# Patient Record
Sex: Male | Born: 1970 | Race: White | Hispanic: No | Marital: Married | Smoking: Never smoker
Health system: Southern US, Community
[De-identification: ages and names within clinical notes are randomized; demographics above are authoritative.]

## PROBLEM LIST (undated history)

## (undated) DIAGNOSIS — E7800 Pure hypercholesterolemia, unspecified: Secondary | ICD-10-CM

## (undated) DIAGNOSIS — B019 Varicella without complication: Secondary | ICD-10-CM

## (undated) DIAGNOSIS — Q892 Congenital malformations of other endocrine glands: Secondary | ICD-10-CM

## (undated) DIAGNOSIS — J309 Allergic rhinitis, unspecified: Secondary | ICD-10-CM

## (undated) DIAGNOSIS — T7840XA Allergy, unspecified, initial encounter: Secondary | ICD-10-CM

## (undated) HISTORY — DX: Congenital malformations of other endocrine glands: Q89.2

## (undated) HISTORY — DX: Varicella without complication: B01.9

## (undated) HISTORY — DX: Allergy, unspecified, initial encounter: T78.40XA

## (undated) HISTORY — PX: MYRINGOPLASTY: SUR873

## (undated) HISTORY — DX: Pure hypercholesterolemia, unspecified: E78.00

## (undated) HISTORY — PX: EYE SURGERY: SHX253

## (undated) HISTORY — DX: Allergic rhinitis, unspecified: J30.9

## (undated) HISTORY — PX: WISDOM TOOTH EXTRACTION: SHX21

---

## 1990-01-07 HISTORY — PX: INGUINAL HERNIA REPAIR: SUR1180

## 2003-01-08 HISTORY — PX: REFRACTIVE SURGERY: SHX103

## 2006-06-18 ENCOUNTER — Encounter: Admission: RE | Admit: 2006-06-18 | Discharge: 2006-06-18 | Payer: Self-pay | Admitting: *Deleted

## 2006-12-18 ENCOUNTER — Encounter: Admission: RE | Admit: 2006-12-18 | Discharge: 2006-12-18 | Payer: Self-pay | Admitting: *Deleted

## 2007-01-05 ENCOUNTER — Encounter: Admission: RE | Admit: 2007-01-05 | Discharge: 2007-01-05 | Payer: Self-pay | Admitting: Family Medicine

## 2009-09-19 ENCOUNTER — Encounter: Admission: RE | Admit: 2009-09-19 | Discharge: 2009-09-19 | Payer: Self-pay | Admitting: Internal Medicine

## 2010-01-28 ENCOUNTER — Encounter: Payer: Self-pay | Admitting: *Deleted

## 2010-12-12 IMAGING — US US SOFT TISSUE HEAD/NECK
1 series · 14 of 25 positions shown · non-contrast
Comparison: 01/05/2007 MR of the neck and 12/18/2006 ultrasound of
the thyroid.

CLINICAL DATA: Follow-up thyroglossal duct cyst.

THYROID ULTRASOUND
TECHNIQUE: Ultrasound examination of the thyroid gland and adjacent
soft tissues was performed.

[Series 1: us soft tissue head/neck · 0.07mm/px · 14 of 26 slices shown]
[im 1/26]
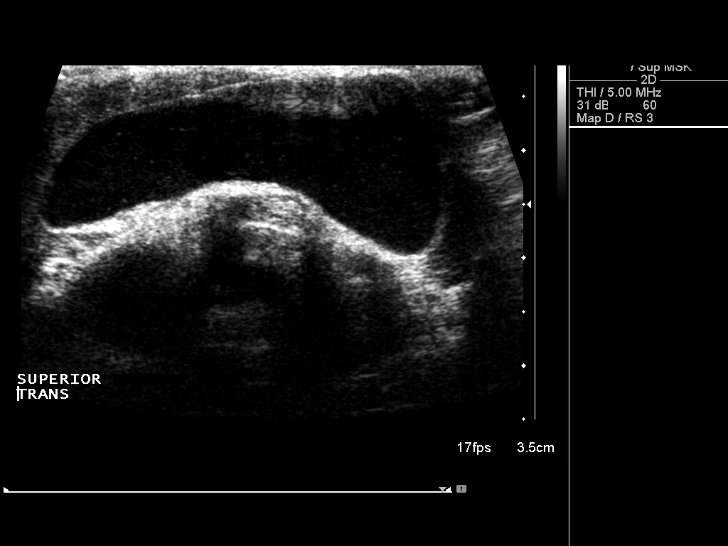
[im 3/26]
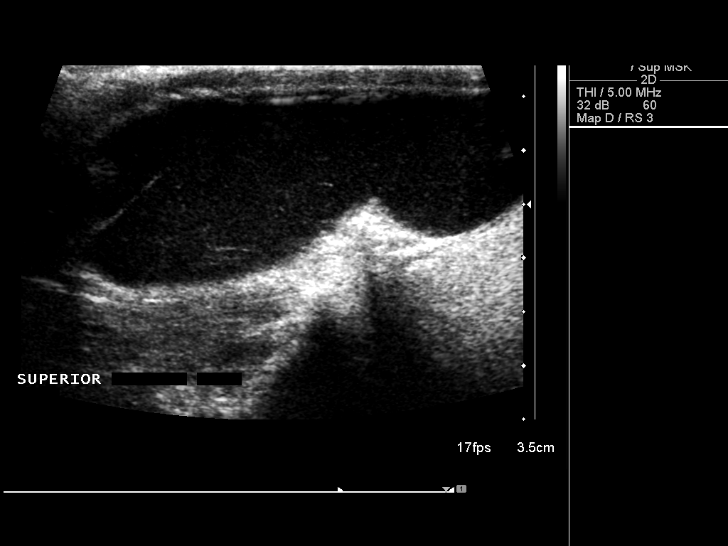
[im 5/26]
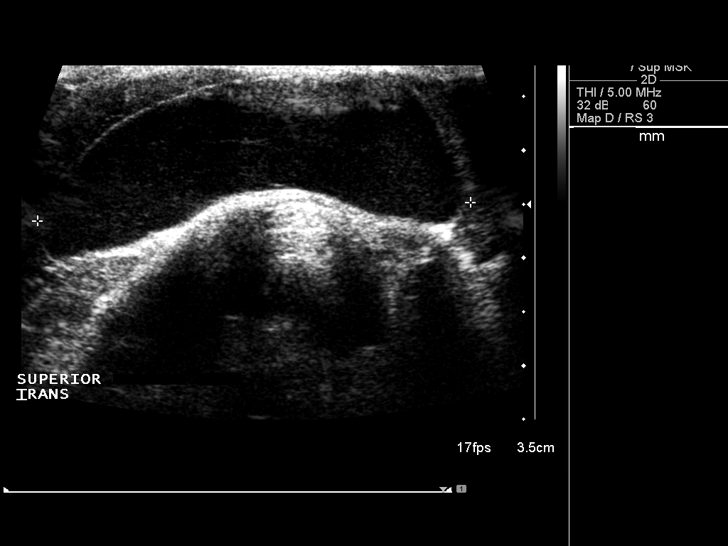
[im 7/26]
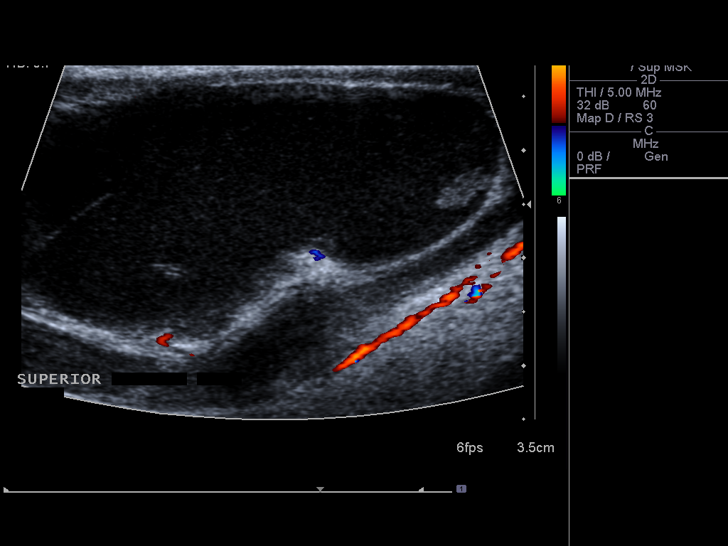
[im 9/26]
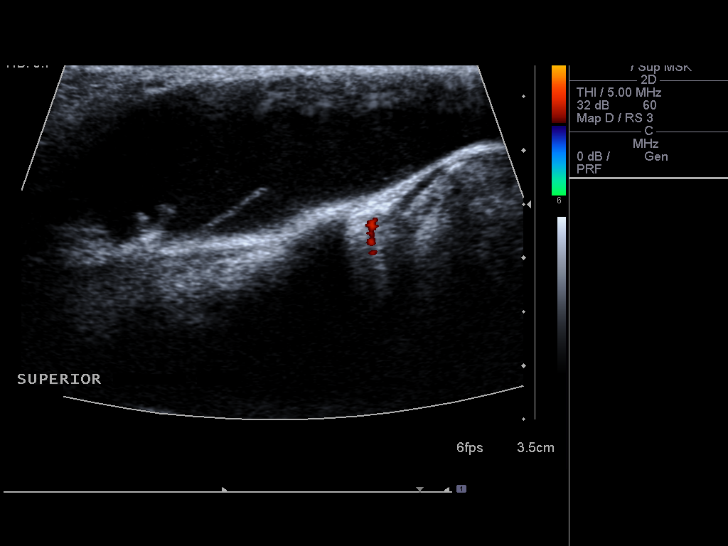
[im 10/26]
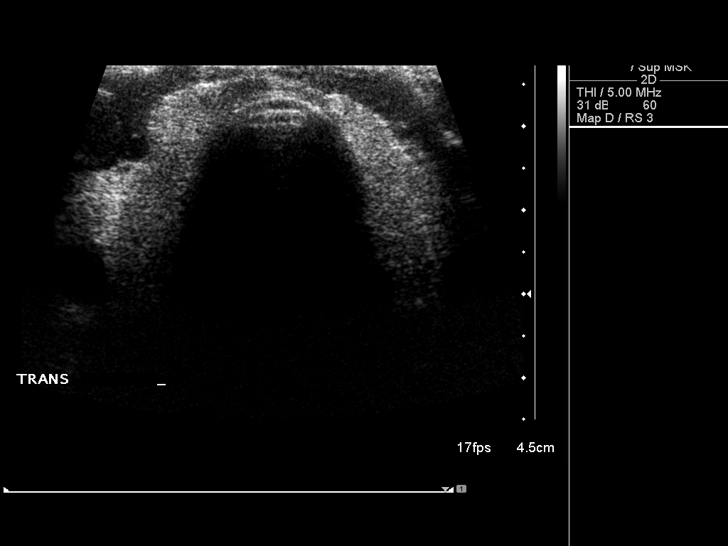
[im 12/26]
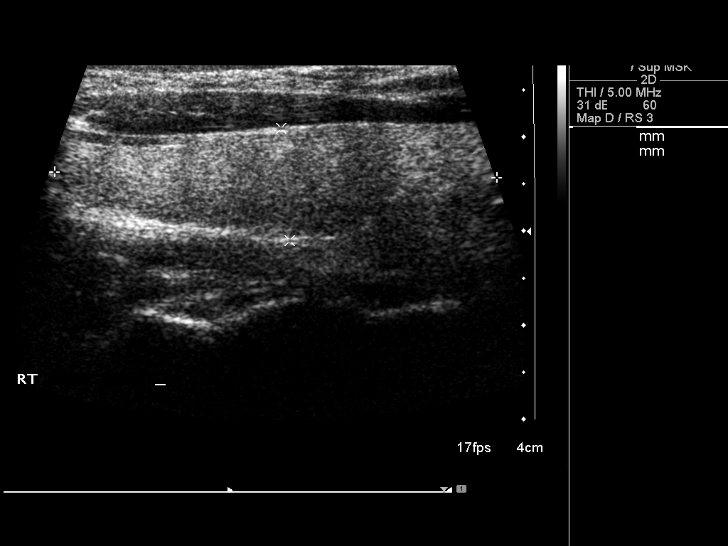
[im 14/26]
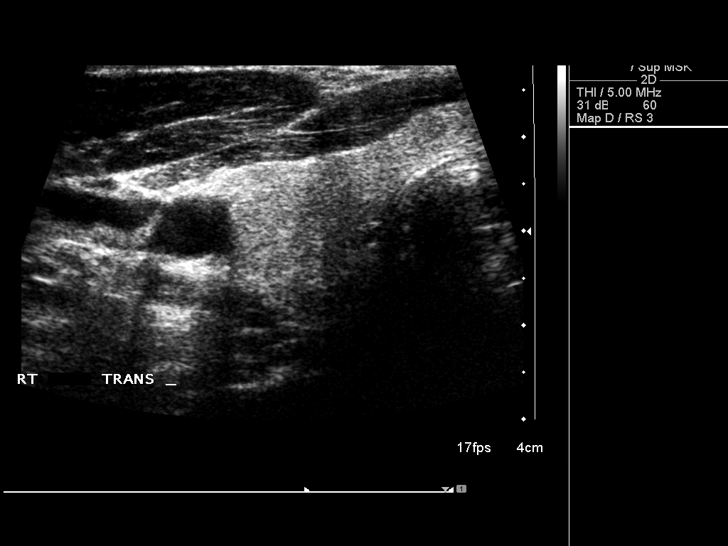
[im 16/26]
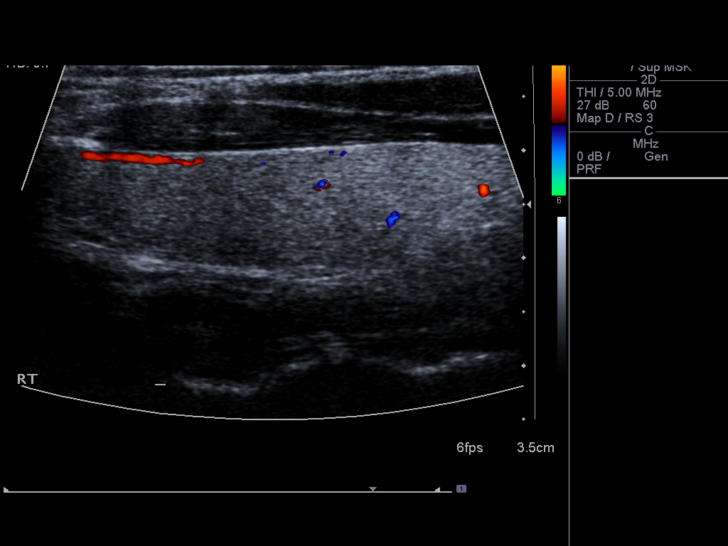
[im 17/26]
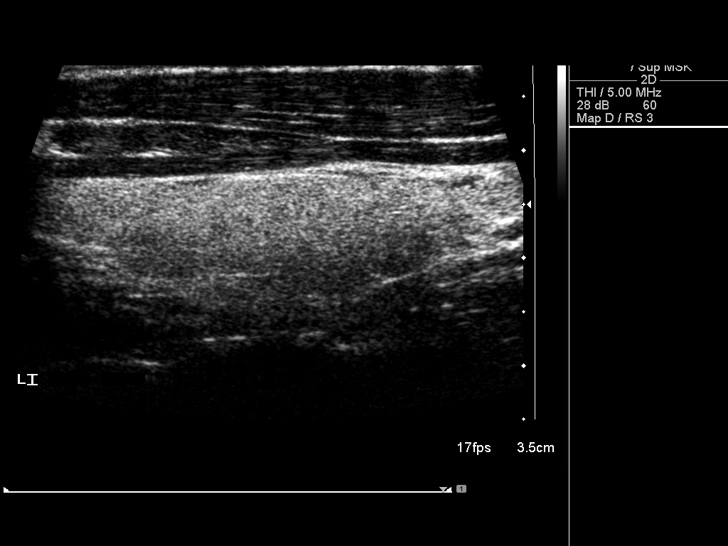
[im 19/26]
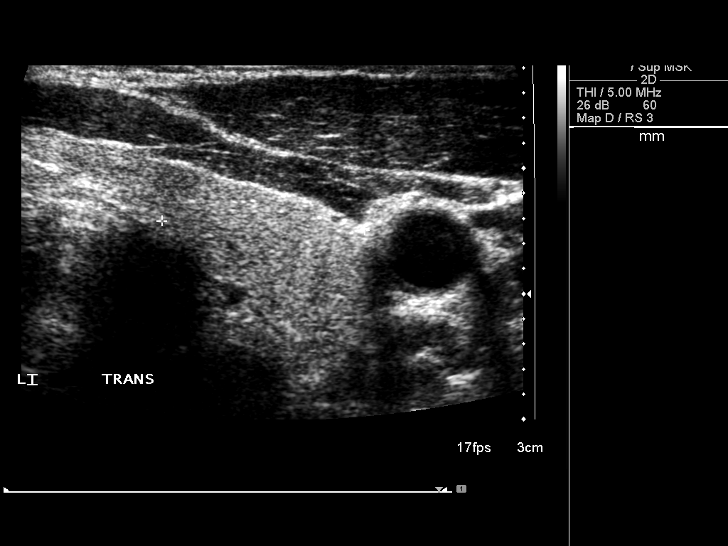
[im 21/26]
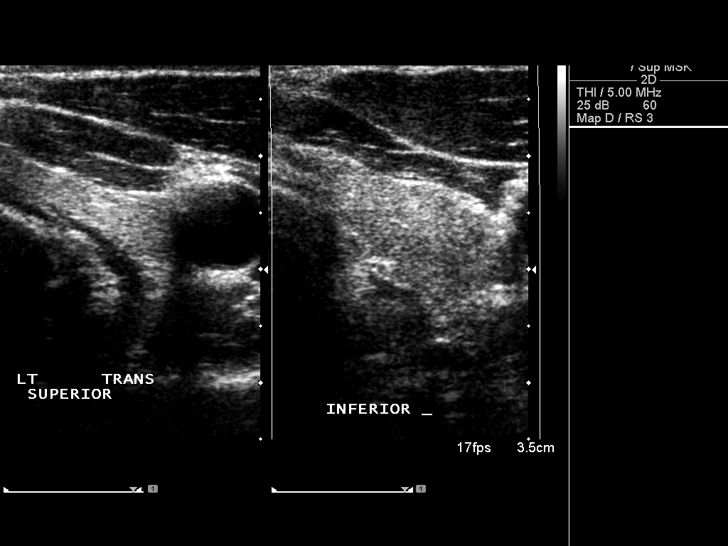
[im 23/26]
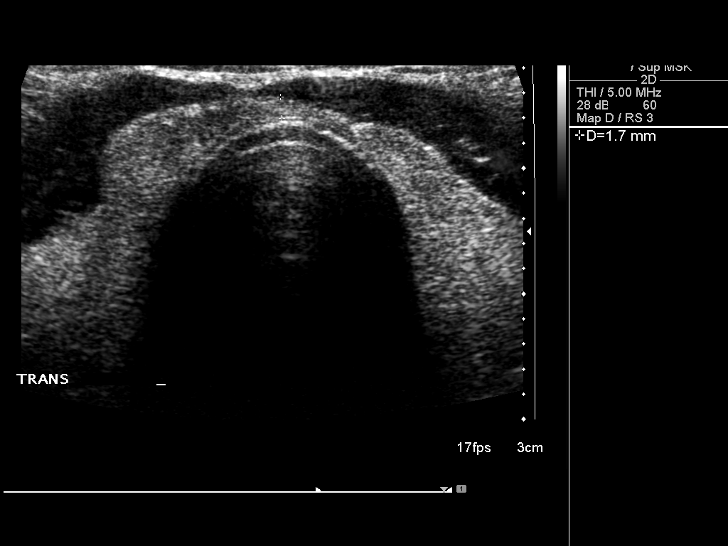
[im 26/26]
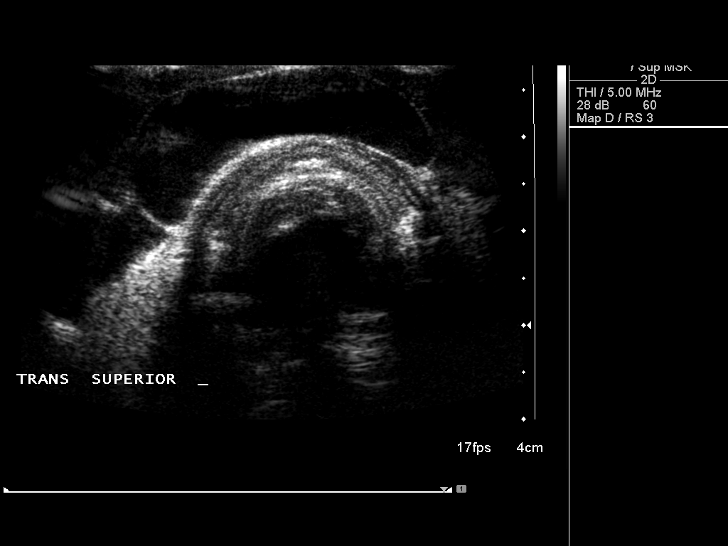

[14 of 25 positions shown; findings below may reference images not displayed]

FINDINGS: Right thyroid lobe:  The right lobe of the thyroid measures 4.7 x
1.2 x 1.9 cm in size.
Left thyroid lobe:  The left lobe of the thyroid measures 4.7 x
x 1.6 cm in size.
Isthmus:  The isthmus measures 2 mm in AP dimension.

Focal nodules:  The thyroid echotexture is homogeneous.  There are
no focal lesions.

Lymphadenopathy:  There is no evidence for adenopathy.

There is a midline cystic structure present which measures 5.9 x
2.0 x 4.0 cm in size.  This does contain internal echoes and
several septations.  This remains compatible with a thyroglossal
duct cyst which has mildly increased in size.
IMPRESSION: The findings, as discussed above, are consistent with a
thyroglossal duct cyst which has increased in size now measuring
5.9 x 4.0 x 2.0 cm in size (previously 5.4 x 3.3 x 1.1 cm in size).

## 2013-02-12 ENCOUNTER — Ambulatory Visit: Payer: Self-pay | Admitting: Cardiovascular Disease

## 2013-03-08 ENCOUNTER — Ambulatory Visit: Payer: Self-pay | Admitting: Cardiovascular Disease

## 2013-03-18 ENCOUNTER — Ambulatory Visit (INDEPENDENT_AMBULATORY_CARE_PROVIDER_SITE_OTHER): Payer: BC Managed Care – PPO | Admitting: Cardiovascular Disease

## 2013-03-18 ENCOUNTER — Encounter: Payer: Self-pay | Admitting: Cardiovascular Disease

## 2013-03-18 VITALS — BP 121/81 | HR 67 | Ht 68.0 in | Wt 186.1 lb

## 2013-03-18 DIAGNOSIS — R079 Chest pain, unspecified: Secondary | ICD-10-CM

## 2013-03-18 NOTE — Patient Instructions (Signed)
Your physician has requested that you have an exercise tolerance test. For further information please visit [email or web address]. Please also follow instruction sheet, as given.  Your physician recommends that you return for lab work in: 6 months for cholesterol screening.  You will need to fast for this appointment (nothing to eat or drink except water after midnight the night before your appointment.  Your physician recommends that you schedule follow-up with Dr. Burt Knack as needed.

## 2013-03-18 NOTE — Progress Notes (Signed)
HPI:   43 year old gentleman presenting for initial cardiac evaluation. He was referred for evaluation of chest discomfort. The patient describes pain along the lateral side of both the left and right chest. He describes this as a dull ache. It is not related to exertion. The symptoms are characterized as mild and self-limited. The pain is nonradiating. There is no relation to position, food intake, or physical activities. He has no associated shortness of breath, diaphoresis, nausea, vomiting, or palpitations. He's had no lightheadedness or syncope. He has no personal history of cardiac disease. Symptoms of been present now for the past few months. He has no other complaints and feels well. He is physically active but not engaged in routine exercise.  No outpatient encounter prescriptions on file as of 03/18/2013.    Review of patient's allergies indicates no known allergies.  Past Medical History  Diagnosis Date  . Hypercholesteremia   . Allergic rhinitis     Past Surgical History  Procedure Laterality Date  . Inguinal hernia repair Bilateral 1992  . Refractive surgery  2005    History   Social History  . Marital Status: Married    Spouse Name: N/A    Number of Children: N/A  . Years of Education: N/A   Occupational History  . Not on file.   Social History Main Topics  . Smoking status: Never Smoker   . Smokeless tobacco: Not on file  . Alcohol Use: Not on file  . Drug Use: Not on file  . Sexual Activity: Not on file   Other Topics Concern  . Not on file   Social History Narrative   The patient is married with 2 children, ages 32 and 39. He is a lifetime nonsmoker. He drinks alcohol only on rare occasions. He works as a Network engineer at [employer].   Family history: The patient's paternal grandfather had coronary bypass surgery in his 4s. There is no other family history of coronary artery disease, myocardial infarction, or stroke.  ROS:  General: no  fevers/chills/night sweats Eyes: no blurry vision, diplopia, or amaurosis ENT: no sore throat or hearing loss Resp: no cough, wheezing, or hemoptysis CV: no edema or palpitations GI: no abdominal pain, nausea, vomiting, diarrhea, or constipation GU: no dysuria, frequency, or hematuria Skin: no rash Neuro: no headache, numbness, tingling, or weakness of extremities Musculoskeletal: no joint pain or swelling Heme: no bleeding, DVT, or easy bruising Endo: no polydipsia or polyuria  BP 121/81  Pulse 67  Ht 5\' 8"  (1.727 m)  Wt 84.423 kg (186 lb 1.9 oz)  BMI 28.31 kg/m2  PHYSICAL EXAM: Pt is alert and oriented, WD, WN, in no distress. HEENT: normal Neck: JVP normal. Carotid upstrokes normal without bruits. No thyromegaly. Lungs: equal expansion, clear bilaterally CV: Apex is discrete and nondisplaced, RRR without murmur or gallop Abd: soft, NT, +BS, no bruit, no hepatosplenomegaly Back: no CVA tenderness Ext: no C/C/E        DP/PT pulses intact and = Skin: warm and dry without rash Neuro: CNII-XII intact             Strength intact = bilaterally  EKG:  Normal sinus rhythm 67 beats per minute, within normal limits.  ASSESSMENT AND PLAN: 1. Atypical chest pain. The patient's only cardiovascular risk factors include a family history of coronary artery disease in his paternal grandfather and personal history of hypercholesterolemia. I reviewed his lipids from recent blood work and his total cholesterol is 203 with an LDL of  142 and HDL 46. I think an exercise treadmill test (not imaging) is indicated in this gentleman with abnormal EKG. Overall his risk of obstructive coronary artery disease is low. I expect his chest pain is musculoskeletal. Another possibility is gastroesophageal reflux disease but his symptoms are somewhat atypical for that as well.  2. Hypercholesterolemia. Reviewed lifestyle modifications with diet and exercise program. He has been following a low carbohydrate diet  of late. Recommended aggressive efforts followed by repeat lipids and LFTs in 6 months.  I will see him back as needed. I appreciate the opportunity to participate in his care.  Sherren Mocha 03/18/2013 5:42 PM   2.

## 2013-04-01 ENCOUNTER — Ambulatory Visit (HOSPITAL_COMMUNITY)
Admission: RE | Admit: 2013-04-01 | Discharge: 2013-04-01 | Disposition: A | Discharge status: Home / Self Care | Payer: BC Managed Care – PPO | Source: Ambulatory Visit | Attending: Cardiology | Admitting: Cardiology

## 2013-04-01 DIAGNOSIS — R079 Chest pain, unspecified: Secondary | ICD-10-CM

## 2013-04-06 ENCOUNTER — Telehealth: Payer: Self-pay | Admitting: Nurse Practitioner

## 2013-04-06 NOTE — Telephone Encounter (Signed)
Note from Dr. Burt Knack: Sharyn Lull - I couldn't figure out how to put a result note on this. Stress test is normal and he doesn't require further cardiac eval. thx ----- Message -- Sherren Mocha, MD   Patient notified of results via telephone; patient verbalized understanding

## 2015-02-27 LAB — TSH: TSH: 1.69 u[IU]/mL (ref 0.41–5.90)

## 2015-02-27 LAB — BASIC METABOLIC PANEL
BUN: 14 mg/dL (ref 4–21)
CREATININE: 1.1 mg/dL (ref 0.6–1.3)
GLUCOSE: 93 mg/dL
Potassium: 4.9 mmol/L (ref 3.4–5.3)
SODIUM: 139 mmol/L (ref 137–147)

## 2015-02-27 LAB — PSA: PSA: 0.92

## 2015-02-27 LAB — LIPID PANEL
Cholesterol: 213 mg/dL — AB (ref 0–200)
HDL: 48 mg/dL (ref 35–70)
LDL CALC: 144 mg/dL
Triglycerides: 109 mg/dL (ref 40–160)

## 2015-02-28 ENCOUNTER — Other Ambulatory Visit: Payer: Self-pay | Admitting: Family Medicine

## 2015-02-28 DIAGNOSIS — Z8639 Personal history of other endocrine, nutritional and metabolic disease: Secondary | ICD-10-CM

## 2015-03-08 ENCOUNTER — Ambulatory Visit
Admission: RE | Admit: 2015-03-08 | Discharge: 2015-03-08 | Disposition: A | Discharge status: Home / Self Care | Payer: 59 | Source: Ambulatory Visit | Attending: Family Medicine | Admitting: Family Medicine

## 2015-03-08 DIAGNOSIS — Z8639 Personal history of other endocrine, nutritional and metabolic disease: Secondary | ICD-10-CM

## 2016-03-07 ENCOUNTER — Encounter: Payer: Self-pay | Admitting: Family Medicine

## 2016-03-07 NOTE — Progress Notes (Signed)
HPI:   [name] is a 47 y.o. male, who is here today for his routine physical. Last seen on 02/27/15 at [facility], [place].   Chronic medical problems: Urine frequency, HLD, thyroglossal cyst, and insomnia.  Thyroglossal cyst, ENT evaluation in 2015, he did not pursue surgical treatment. Thyroid U/S 03/08/15: Thyroglossal duct cyst slightly smaller, 4.8 cm (5.9). He denies dysphagia or stridor.  HLD: Last FLP in 02/2015: TC 213,TG 109,HDL 48, and LDL 144. He is on non pharmacologic treatment.  Urinary frequency and nocturia x 1, otherwise stable, exacerbated by increasing fluid intake. Denies gross hematuria. Rectal exam last year, prostate mildly enlarged. PSA 0.92.  Hx of STD's: Denies.  He exercises regularly and following a new diet since 09/2015.He has lost about 30-35 pounds.Low fat and low carb diet. He lives with wife and 2 children.  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 Tolerating Ambien well, usually #30 tabs last all year.  Concerns today: Perianal pruritus.  Intermittent  Hx of hemorrhoids, has not had symptoms for a few months now. Not sure about exacerbation factors, alleviated by OTC "itchy" medication.Currently he is asymptomatic.   Review of Systems  Constitutional: Negative for activity change, appetite change, fatigue, fever and unexpected weight change.  HENT: Negative for dental problem, mouth sores, nosebleeds, sore throat, trouble swallowing and voice change.   Eyes: Negative for pain, redness and visual disturbance.  Respiratory: Negative for apnea, cough, shortness of breath and wheezing.   Cardiovascular: Negative for chest pain, palpitations and leg swelling.  Gastrointestinal: Positive for constipation (sometimes.). Negative for abdominal pain, blood in stool, nausea and vomiting.  Endocrine: Negative for cold intolerance, heat intolerance,  polydipsia, polyphagia and polyuria.  Genitourinary: Positive for frequency (unchanged). Negative for decreased urine volume, dysuria, genital sores, hematuria and testicular pain.  Musculoskeletal: Negative for arthralgias, gait problem, myalgias and neck pain.  Skin: Negative for color change, rash and wound.  Allergic/Immunologic: Positive for environmental allergies.  Neurological: Negative for dizziness, syncope, weakness, numbness and headaches.  Hematological: Negative for adenopathy. Does not bruise/bleed easily.  Psychiatric/Behavioral: Negative for confusion. The patient is not nervous/anxious.   All other systems reviewed and are negative.   No current outpatient prescriptions on file prior to visit.   No current facility-administered medications on file prior to visit.      Past Medical History:  Diagnosis Date  . Allergic rhinitis   . Allergy   . Hypercholesteremia   . Thyroglossal duct cyst    Followed with ENT last in 2015   No Known Allergies  Family History  Problem Relation Age of Onset  . Heart disease Maternal Grandmother   . Hypertension Maternal Grandmother   . Stroke Maternal Grandfather   . Cancer Paternal Grandmother     ? Brain  . Heart disease Paternal Grandfather     MI    Social History   Social History  . Marital status: Married    Spouse name: N/A  . Number of children: N/A  . Years of education: N/A   Social History Main Topics  . Smoking status: Never Smoker  . Smokeless tobacco: Never Used  . Alcohol use No  . Drug use: No  . Sexual activity: Yes   Other Topics Concern  . None   Social History Narrative   The patient is married with 2 children, ages 42 and 43. He  is a lifetime nonsmoker. He drinks alcohol only on rare occasions. He works as a Network engineer at [employer].    Vitals:   03/08/16 0704  BP: 100/70  Pulse: 71  Temp: 97.6 F (36.4 C)   O2 sat 97% at RA. Body mass index is 24.33 kg/m.   Physical Exam    Nursing note and vitals reviewed. Constitutional: He is oriented to person, place, and time. He appears well-developed and well-nourished. No distress.  HENT:  Head: Atraumatic.  Right Ear: Hearing, tympanic membrane, external ear and ear canal normal.  Left Ear: Hearing, tympanic membrane, external ear and ear canal normal.  Mouth/Throat: Oropharynx is clear and moist and mucous membranes are normal.  Eyes: Conjunctivae and EOM are normal. Pupils are equal, round, and reactive to light.  Neck: Normal range of motion. No JVD present. No muscular tenderness present. No tracheal deviation present.    Anterior aspect of neck: Soft,mobile nodule,no tender, defined borders,about 4 cm.  Cardiovascular: Normal rate and regular rhythm.   No murmur heard. Pulses:      Dorsalis pedis pulses are 2+ on the right side, and 2+ on the left side.  Respiratory: Effort normal and breath sounds normal. No respiratory distress.  GI: Soft. He exhibits no mass. There is no tenderness.  Genitourinary: Rectal exam shows external hemorrhoid.  Genitourinary Comments: No erythematous rash, mild post inflammatory hyperpigmentation.   Musculoskeletal: He exhibits no edema or tenderness.  No major deformities appreciated and no signs of synovitis.  Lymphadenopathy:    He has no cervical adenopathy.       Right: No supraclavicular adenopathy present.       Left: No supraclavicular adenopathy present.  Neurological: He is alert and oriented to person, place, and time. He has normal strength. No cranial nerve deficit or sensory deficit. Coordination and gait normal.  Reflex Scores:      Bicep reflexes are 2+ on the right side and 2+ on the left side.      Patellar reflexes are 2+ on the right side and 2+ on the left side. Skin: Skin is warm. No erythema.  Psychiatric: He has a normal mood and affect. Cognition and memory are normal.  Well groomed,good eye contact.      ASSESSMENT AND PLAN:   Cristian Heath was  seen today for establish care.  Diagnoses and all orders for this visit:   Lab Results  Component Value Date   CHOL 197 03/08/2016   HDL 42.70 03/08/2016   LDLCALC 140 (H) 03/08/2016   TRIG 68.0 03/08/2016   CHOLHDL 5 03/08/2016     Chemistry      Component Value Date/Time   NA 135 03/08/2016 0837   K 4.3 03/08/2016 0837   CL 98 03/08/2016 0837   CO2 31 03/08/2016 0837   BUN 11 03/08/2016 0837   CREATININE 0.95 03/08/2016 0837      Component Value Date/Time   CALCIUM 9.5 03/08/2016 0837   ALKPHOS 51 03/08/2016 0837   AST 13 03/08/2016 0837   ALT 11 03/08/2016 0837   BILITOT 1.4 (H) 03/08/2016 0837      Routine adult health maintenance   We discussed the importance of regular physical activity and healthy diet for prevention of chronic illness and/or complications. Preventive guidelines reviewed. Vaccination up to date.  Next CPE in 1 year.   -     Lipid panel -     Urinalysis, Routine w reflex microscopic -     Comprehensive metabolic panel  Hyperlipidemia, unspecified hyperlipidemia type  No changes in current management, will follow labs done today and will give further recommendations accordingly. F/U in 6-12 months.  -     Lipid panel  Perianal dermatitis  Currently asymptomatic. Recommend small amount of topical steroid as needed and for no longer than 14 days. Good hygiene, wipes instead toile paper to clean area after defecation. Desitin cream at bedtime. F/U as needed.   -     hydrocortisone (ANUSOL-HC) 2.5 % rectal cream; Place 1 application rectally 2 (two) times daily as needed for hemorrhoids or itching.  Circadian rhythm sleep disorder  Some side effects of Ambien discussed. He is tolerating well, continue medication just when travelling overseas and as needed. Sine Rx of 30 tabs lasts a year,we can follow annually.  -     zolpidem (AMBIEN) 5 MG tablet; Take 1 tablet (5 mg total) by mouth at bedtime as needed for sleep.     Betty  G. Martinique, MD  [facility]. [place] office.

## 2016-03-08 ENCOUNTER — Encounter: Payer: Self-pay | Admitting: Family Medicine

## 2016-03-08 ENCOUNTER — Ambulatory Visit (INDEPENDENT_AMBULATORY_CARE_PROVIDER_SITE_OTHER): Payer: 59 | Admitting: Family Medicine

## 2016-03-08 VITALS — BP 100/70 | HR 71 | Temp 97.6°F | Ht 68.25 in | Wt 161.2 lb

## 2016-03-08 DIAGNOSIS — Z0000 Encounter for general adult medical examination without abnormal findings: Secondary | ICD-10-CM | POA: Diagnosis not present

## 2016-03-08 DIAGNOSIS — G4720 Circadian rhythm sleep disorder, unspecified type: Secondary | ICD-10-CM | POA: Diagnosis not present

## 2016-03-08 DIAGNOSIS — E785 Hyperlipidemia, unspecified: Secondary | ICD-10-CM | POA: Diagnosis not present

## 2016-03-08 DIAGNOSIS — L309 Dermatitis, unspecified: Secondary | ICD-10-CM

## 2016-03-08 LAB — URINALYSIS, ROUTINE W REFLEX MICROSCOPIC
BILIRUBIN URINE: NEGATIVE
KETONES UR: 15 — AB
LEUKOCYTES UA: NEGATIVE
NITRITE: NEGATIVE
PH: 6 (ref 5.0–8.0)
Specific Gravity, Urine: 1.015 (ref 1.000–1.030)
Total Protein, Urine: NEGATIVE
UROBILINOGEN UA: 0.2 (ref 0.0–1.0)
Urine Glucose: NEGATIVE
WBC UA: NONE SEEN (ref 0–?)

## 2016-03-08 LAB — COMPREHENSIVE METABOLIC PANEL
ALT: 11 U/L (ref 0–53)
AST: 13 U/L (ref 0–37)
Albumin: 4.5 g/dL (ref 3.5–5.2)
Alkaline Phosphatase: 51 U/L (ref 39–117)
BUN: 11 mg/dL (ref 6–23)
CALCIUM: 9.5 mg/dL (ref 8.4–10.5)
CHLORIDE: 98 meq/L (ref 96–112)
CO2: 31 meq/L (ref 19–32)
CREATININE: 0.95 mg/dL (ref 0.40–1.50)
GFR: 90.1 mL/min (ref >60.00)
Glucose, Bld: 85 mg/dL (ref 70–99)
Potassium: 4.3 mEq/L (ref 3.5–5.1)
Sodium: 135 mEq/L (ref 135–145)
Total Bilirubin: 1.4 mg/dL — ABNORMAL HIGH (ref 0.2–1.2)
Total Protein: 6.7 g/dL (ref 6.0–8.3)

## 2016-03-08 LAB — LIPID PANEL
CHOLESTEROL: 197 mg/dL (ref 0–200)
HDL: 42.7 mg/dL (ref >39.00)
LDL CALC: 140 mg/dL — AB (ref 0–99)
NonHDL: 153.86
Total CHOL/HDL Ratio: 5
Triglycerides: 68 mg/dL (ref 0.0–149.0)
VLDL: 13.6 mg/dL (ref 0.0–40.0)

## 2016-03-08 MED ORDER — HYDROCORTISONE 2.5 % RE CREA: 1.0000 "application " | TOPICAL_CREAM | Freq: Two times a day (BID) | RECTAL | 1 refills | Status: DC | PRN

## 2016-03-08 MED ORDER — ZOLPIDEM TARTRATE 5 MG PO TABS: 5.0000 mg | ORAL_TABLET | Freq: Every evening | ORAL | 0 refills | Status: DC | PRN

## 2016-03-08 NOTE — Patient Instructions (Addendum)
A few things to remember from today's visit:   Routine adult health maintenance - Plan: Lipid panel, Urinalysis, Routine w reflex microscopic, Comprehensive metabolic panel  Hyperlipidemia, unspecified hyperlipidemia type - Plan: Lipid panel  Perianal dermatitis - Plan: hydrocortisone (ANUSOL-HC) 2.5 % rectal cream    At least 150 minutes of moderate exercise per week, daily brisk walking for 15-30 min is a good exercise option. Healthy diet low in saturated (animal) fats and sweets and consisting of fresh fruits and vegetables, lean meats such as fish and white chicken and whole grains.  - Vaccines:  Tdap vaccine every 10 years.  Shingles vaccine recommended at age 61, could be given after 48 years of age but not sure about insurance coverage.  Pneumonia vaccines:  Prevnar 44 at 58 and Pneumovax at 78.   -Screening recommendations for low/normal risk males:  Screening for diabetes at age 21-45 and every 3 years.    Lipid screening at 35 and every 3 years.   Colonoscopy for colon cancer screening at age 61 and until age 16.  Prostate cancer screening: some controversy.        Please be sure medication list is accurate. If a new problem present, please set up appointment sooner than planned today.

## 2016-03-08 NOTE — Progress Notes (Signed)
Pre visit review using our clinic review tool, if applicable. No additional management support is needed unless otherwise documented below in the visit note. 

## 2016-03-09 ENCOUNTER — Encounter: Payer: Self-pay | Admitting: Family Medicine

## 2016-03-11 ENCOUNTER — Encounter: Payer: Self-pay | Admitting: Family Medicine

## 2016-05-30 IMAGING — US US SOFT TISSUE HEAD/NECK
1 series · 14 of 25 positions shown · non-contrast
Comparison: 09/19/2009

CLINICAL DATA: Followup thyroglossal duct cysts

EXAM:
THYROID ULTRASOUND
TECHNIQUE: Ultrasound examination of the thyroid gland and adjacent soft
tissues was performed.

[Series 1: us soft tissue head/neck · 0.08mm/px · 14 of 47 slices shown]
[im 1/47]
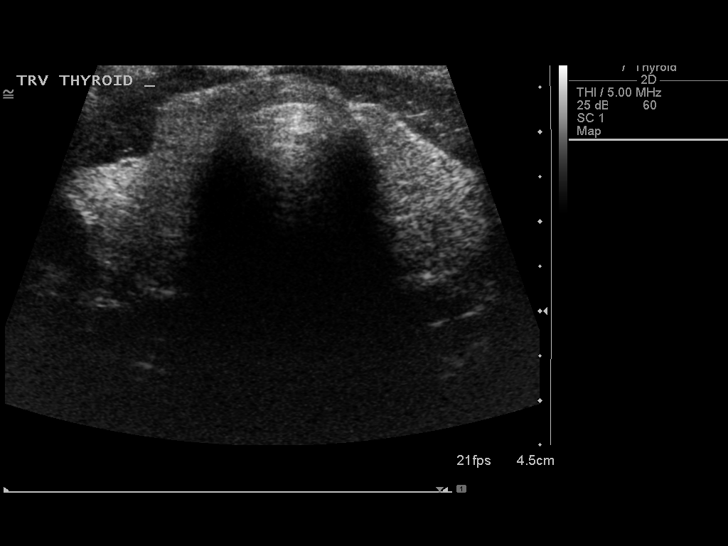
[im 4/47]
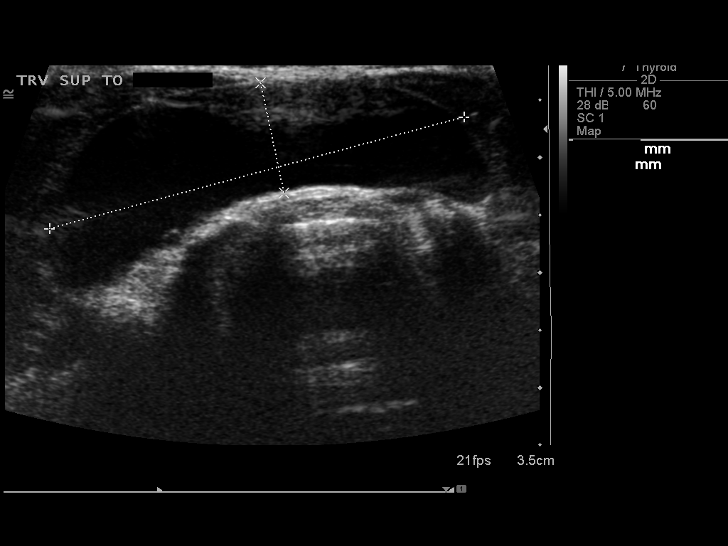
[im 8/47]
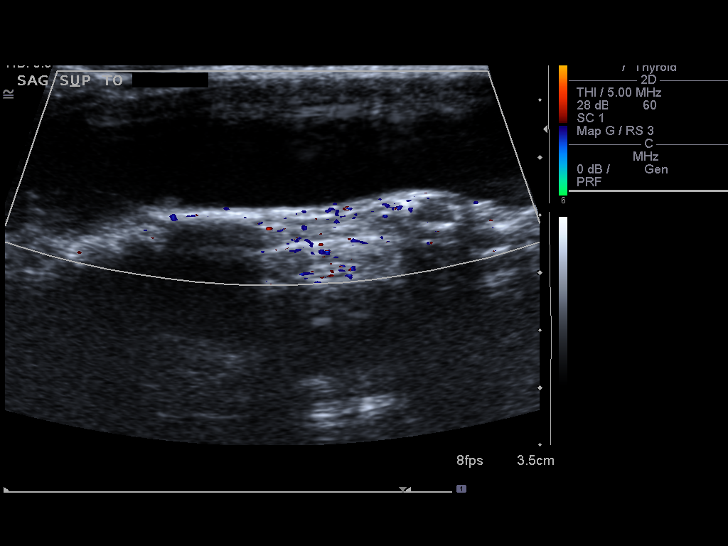
[im 12/47]
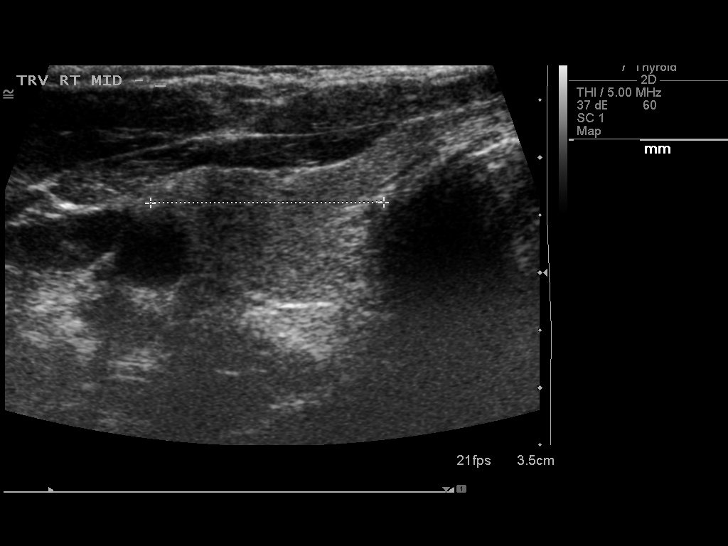
[im 16/47]
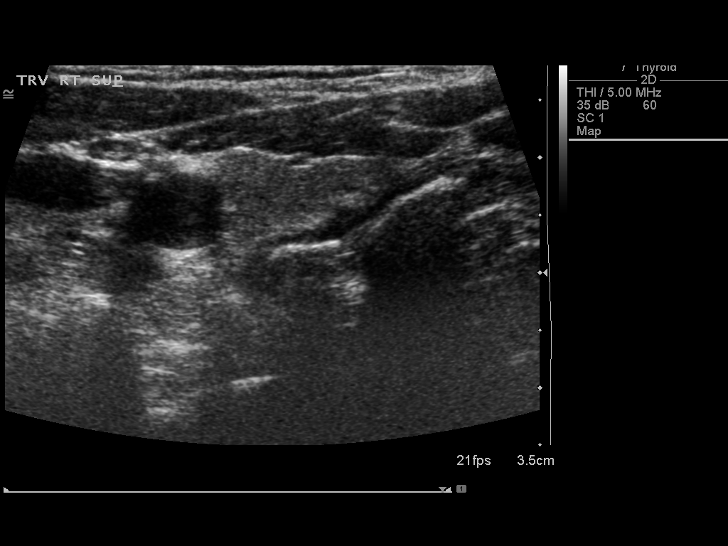
[im 18/47]
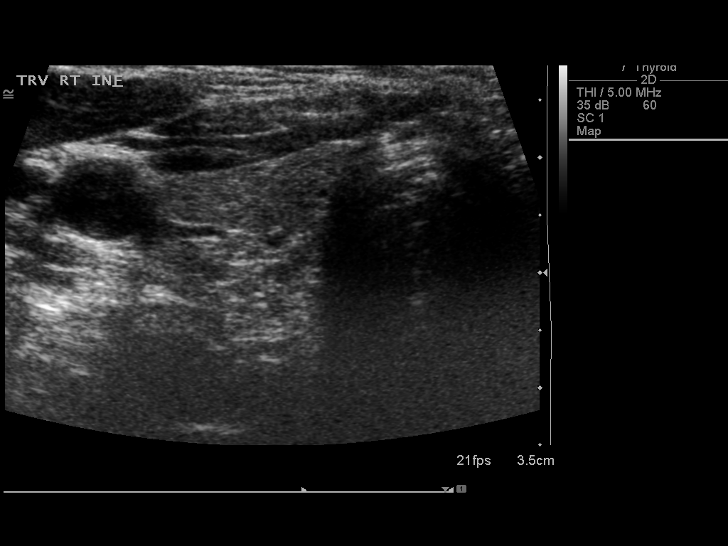
[im 22/47]
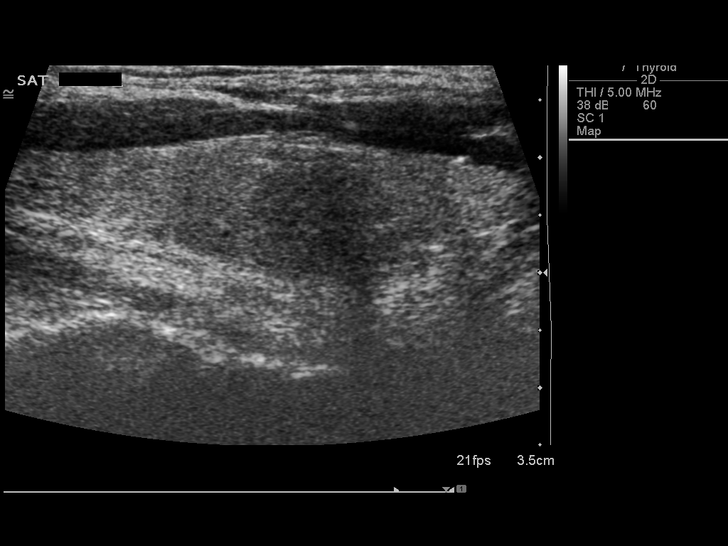
[im 25/47]
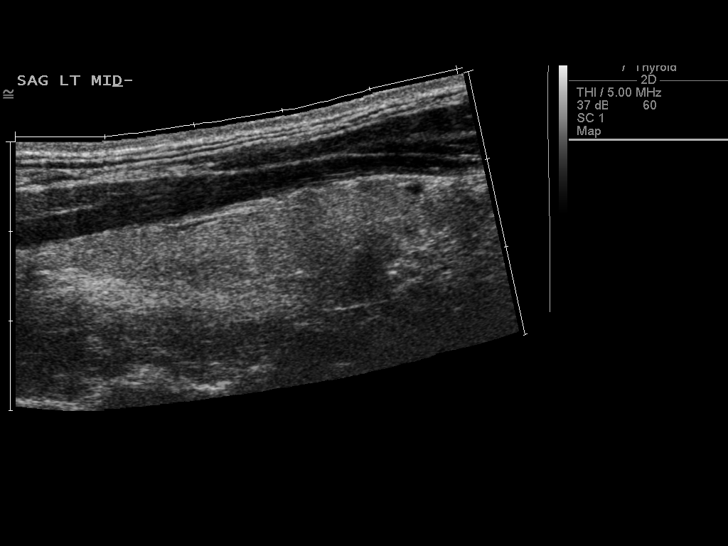
[im 29/47]
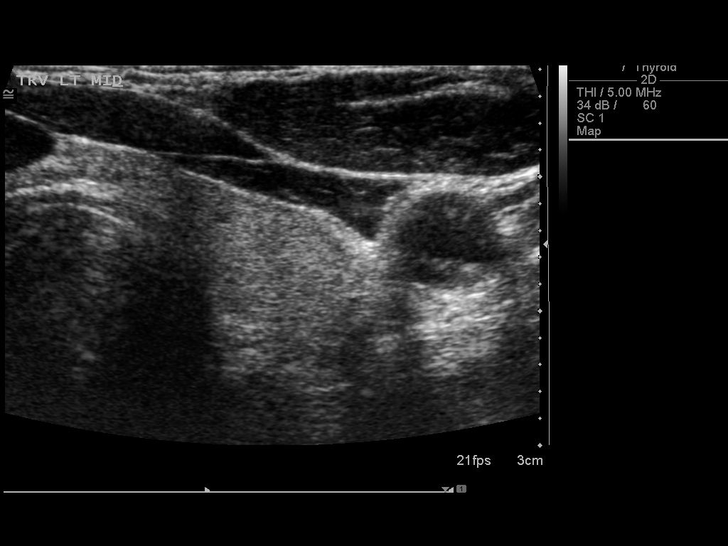
[im 31/47]
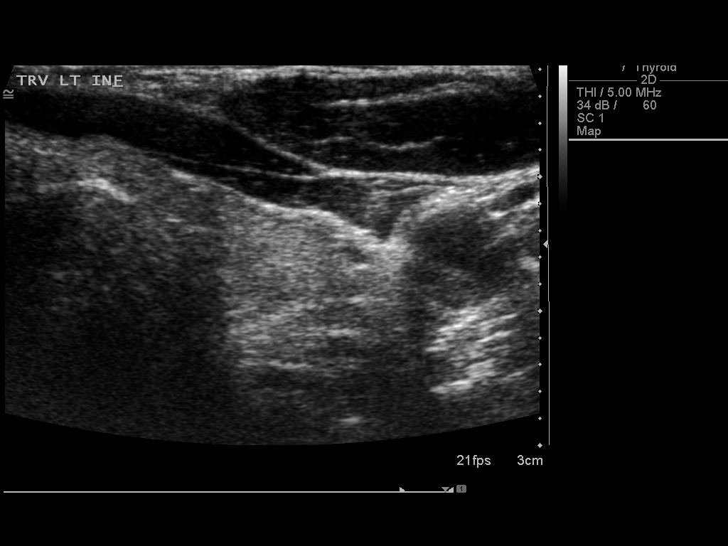
[im 35/47]
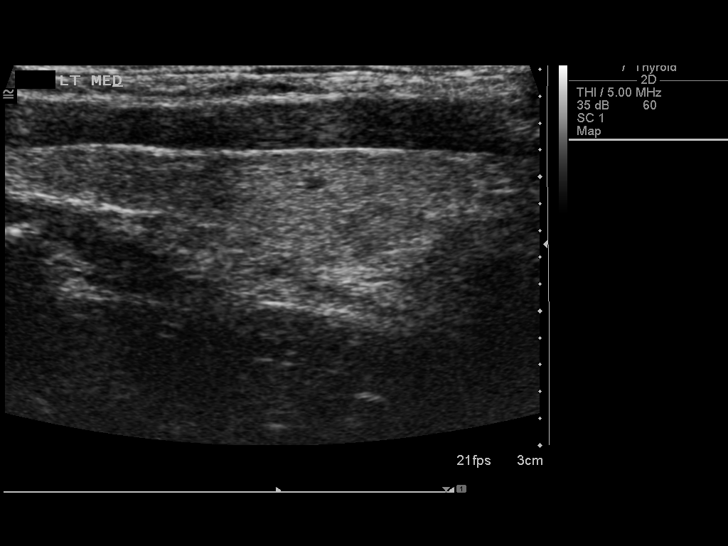
[im 39/47]
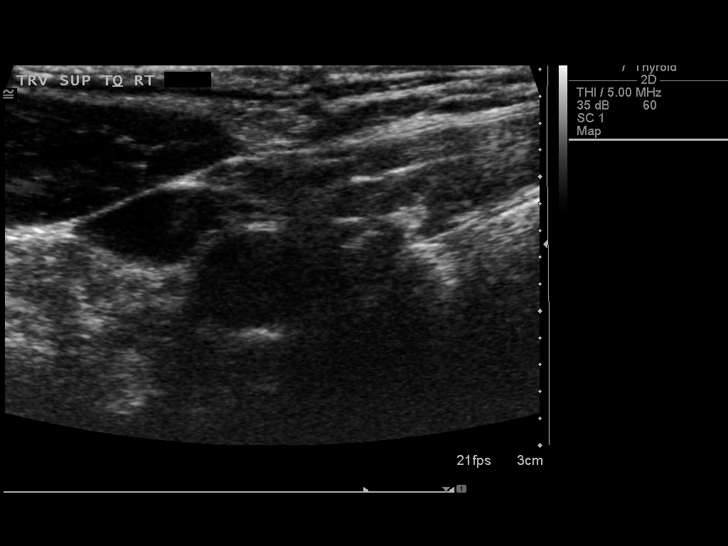
[im 43/47]
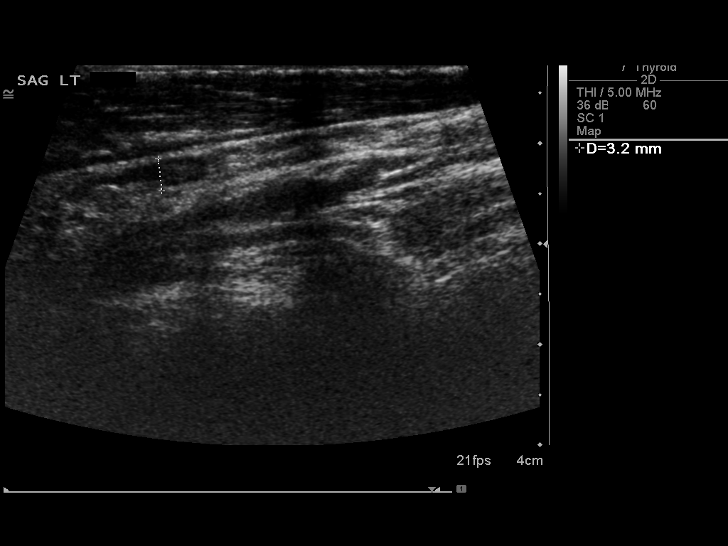
[im 47/47]
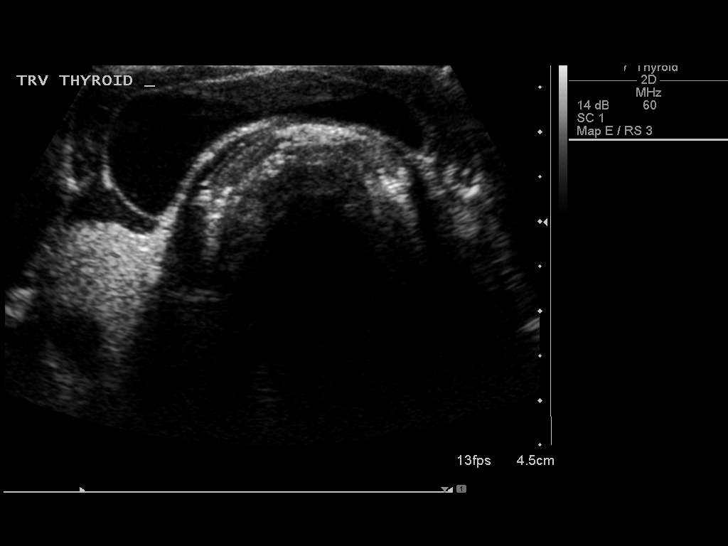

[14 of 25 positions shown; findings below may reference images not displayed]

FINDINGS: Right thyroid lobe

Measurements: 5.2 x 1.3 x 2.0 cm.  No nodules visualized.

Left thyroid lobe

Measurements: 4.8 x 1.2 x 1.5 cm.  No nodules visualized.

Isthmus

Thickness: 2 mm.  No nodules visualized.

Lymphadenopathy

The cystic subcutaneous lesion superior to the isthmus of the
thyroid gland measures 3.7 x 1.0 x 4.8 cm. It is anechoic.
Previously, it measured 5.9 x 2.0 x 4.0 cm.
IMPRESSION: The thyroglossal duct cyst just above the isthmus is slightly
smaller today measuring up to 4.8 cm and previously measuring up to
5.9 cm.

## 2016-06-19 DIAGNOSIS — H00015 Hordeolum externum left lower eyelid: Secondary | ICD-10-CM | POA: Diagnosis not present

## 2016-07-03 DIAGNOSIS — H00015 Hordeolum externum left lower eyelid: Secondary | ICD-10-CM | POA: Diagnosis not present

## 2017-03-10 ENCOUNTER — Other Ambulatory Visit (INDEPENDENT_AMBULATORY_CARE_PROVIDER_SITE_OTHER): Payer: 59

## 2017-03-10 ENCOUNTER — Other Ambulatory Visit: Payer: Self-pay | Admitting: *Deleted

## 2017-03-10 DIAGNOSIS — E785 Hyperlipidemia, unspecified: Secondary | ICD-10-CM

## 2017-03-10 DIAGNOSIS — Z13220 Encounter for screening for lipoid disorders: Secondary | ICD-10-CM

## 2017-03-10 DIAGNOSIS — Z131 Encounter for screening for diabetes mellitus: Secondary | ICD-10-CM | POA: Diagnosis not present

## 2017-03-10 LAB — URINALYSIS, ROUTINE W REFLEX MICROSCOPIC
Leukocytes, UA: NEGATIVE
NITRITE: NEGATIVE
Specific Gravity, Urine: >=1.03 — AB (ref 1.000–1.030)
TOTAL PROTEIN, URINE-UPE24: NEGATIVE
URINE GLUCOSE: NEGATIVE
Urobilinogen, UA: 0.2 (ref 0.0–1.0)
pH: 5.5 (ref 5.0–8.0)

## 2017-03-10 LAB — BASIC METABOLIC PANEL
BUN: 11 mg/dL (ref 6–23)
CHLORIDE: 97 meq/L (ref 96–112)
CO2: 24 mEq/L (ref 19–32)
Calcium: 10.1 mg/dL (ref 8.4–10.5)
Creatinine, Ser: 1.13 mg/dL (ref 0.40–1.50)
GFR: 73.44 mL/min (ref >60.00)
GLUCOSE: 60 mg/dL — AB (ref 70–99)
POTASSIUM: 4.8 meq/L (ref 3.5–5.1)
Sodium: 138 mEq/L (ref 135–145)

## 2017-03-10 LAB — CBC WITH DIFFERENTIAL/PLATELET
BASOS ABS: 0 10*3/uL (ref 0.0–0.1)
Basophils Relative: 0.6 % (ref 0.0–3.0)
EOS ABS: 0.1 10*3/uL (ref 0.0–0.7)
Eosinophils Relative: 1.9 % (ref 0.0–5.0)
HEMATOCRIT: 43.6 % (ref 39.0–52.0)
HEMOGLOBIN: 15.2 g/dL (ref 13.0–17.0)
LYMPHS PCT: 22 % (ref 12.0–46.0)
Lymphs Abs: 1.1 10*3/uL (ref 0.7–4.0)
MCHC: 34.8 g/dL (ref 30.0–36.0)
MCV: 93.5 fl (ref 78.0–100.0)
Monocytes Absolute: 0.4 10*3/uL (ref 0.1–1.0)
Monocytes Relative: 7.6 % (ref 3.0–12.0)
Neutro Abs: 3.3 10*3/uL (ref 1.4–7.7)
Neutrophils Relative %: 67.9 % (ref 43.0–77.0)
PLATELETS: 252 10*3/uL (ref 150.0–400.0)
RBC: 4.66 Mil/uL (ref 4.22–5.81)
RDW: 12.2 % (ref 11.5–15.5)
WBC: 4.8 10*3/uL (ref 4.0–10.5)

## 2017-03-10 LAB — LIPID PANEL
CHOL/HDL RATIO: 6
Cholesterol: 222 mg/dL — ABNORMAL HIGH (ref 0–200)
HDL: 37.3 mg/dL — AB (ref >39.00)
LDL Cholesterol: 163 mg/dL — ABNORMAL HIGH (ref 0–99)
NonHDL: 184.4
TRIGLYCERIDES: 106 mg/dL (ref 0.0–149.0)
VLDL: 21.2 mg/dL (ref 0.0–40.0)

## 2017-03-13 NOTE — Progress Notes (Signed)
HPI:  [name] is a 47 y.o.male here today for his routine physical examination.  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 He has not noted changes or dysphagia.    He lives with his wife and 2 children.  Regular exercise 3 or more times per week: yes Following a healthy diet:Yes, he is following Keto diet.   Chronic medical problems: Thyroglossal cyst,HLD, urinary frequency,and insomnia among some. Last thyroid US 03/2015:  Hx of STD's: Denies  Immunization History  Administered Date(s) Administered  . Influenza-Unspecified 10/18/2015    -Denies high alcohol intake, tobacco use, or Hx of illicit drug use.  Hyperlipidemia:  Currently on nonpharmacologic treatment. Following a low fat diet: Yes.   Lab Results  Component Value Date   CHOL 222 (H) 03/10/2017   HDL 37.30 (L) 03/10/2017   LDLCALC 163 (H) 03/10/2017   TRIG 106.0 03/10/2017   CHOLHDL 6 03/10/2017    Insomnia: Ambien 5 mg at bedtime as needed, usually he takes it when her travels to [place]  Urinary frequency stable and nocturia improved after decreasing fluid intake around  Denies dysuria, gross hematuria,or decreased urine output. Recent urine dipstick + ketones.   Review of Systems  Constitutional: Negative for activity change, appetite change, fatigue, fever and unexpected weight change.  HENT: Negative for dental problem, nosebleeds, sore throat, trouble swallowing and voice change.   Eyes: Negative for redness and visual disturbance.  Respiratory: Negative for cough, shortness of breath and wheezing.   Cardiovascular: Negative for chest pain, palpitations and leg swelling.  Gastrointestinal: Negative for abdominal pain, blood in stool, nausea and vomiting.       No changes in bowel habits.  Endocrine: Negative for cold intolerance, heat intolerance, polydipsia,  polyphagia and polyuria.  Genitourinary: Negative for decreased urine volume, dysuria, flank pain, genital sores, hematuria and testicular pain.  Musculoskeletal: Negative for arthralgias, back pain, joint swelling and myalgias.  Skin: Negative for color change and rash.  Neurological: Negative for dizziness, syncope, weakness and headaches.  Hematological: Negative for adenopathy. Does not bruise/bleed easily.  Psychiatric/Behavioral: Negative for confusion and sleep disturbance. The patient is nervous/anxious.   All other systems reviewed and are negative.    Current Outpatient Medications on File Prior to Visit  Medication Sig Dispense Refill  . cetirizine (ZYRTEC) 10 MG tablet Take 10 mg by mouth daily.    . hydrocortisone (ANUSOL-HC) 2.5 % rectal cream Place 1 application rectally 2 (two) times daily as needed for hemorrhoids or itching. 30 g 1   No current facility-administered medications on file prior to visit.      Past Medical History:  Diagnosis Date  . Allergic rhinitis   . Allergy   . Chicken pox   . Hypercholesteremia   . Thyroglossal duct cyst    Followed with ENT last in 2015    Past Surgical History:  Procedure Laterality Date  . EYE SURGERY     Corneal Lasik,bilateral;01/2001  . INGUINAL HERNIA REPAIR Bilateral 1992  . MYRINGOPLASTY     at age 47.  . REFRACTIVE SURGERY  2005    No Known Allergies  Family History  Problem Relation Age of Onset  . Heart disease Maternal Grandmother   . Hypertension Maternal Grandmother   . Stroke Maternal Grandfather   . Cancer Paternal Grandmother        ? Brain  .  Heart disease Paternal Grandfather        MI    Social History   Socioeconomic History  . Marital status: Married    Spouse name: None  . Number of children: None  . Years of education: None  . Highest education level: None  Social Needs  . Financial resource strain: None  . Food insecurity - worry: None  . Food insecurity - inability: None  .  Transportation needs - medical: None  . Transportation needs - non-medical: None  Occupational History  . None  Tobacco Use  . Smoking status: Never Smoker  . Smokeless tobacco: Never Used  Substance and Sexual Activity  . Alcohol use: No  . Drug use: No  . Sexual activity: Yes  Other Topics Concern  . None  Social History Narrative   The patient is married with 2 children, ages 41 and 81. He is a lifetime nonsmoker. He drinks alcohol only on rare occasions. He works as a Network engineer at [employer].     Vitals:   03/14/17 0758  BP: 116/82  Pulse: 71  Resp: 12  Temp: 97.6 F (36.4 C)  SpO2: 98%   Body mass index is 24.97 kg/m.   Wt Readings from Last 3 Encounters:  03/14/17 164 lb 6 oz (74.6 kg)  03/08/16 161 lb 3.2 oz (73.1 kg)  03/18/13 186 lb 1.9 oz (84.4 kg)    Physical Exam  Nursing note and vitals reviewed. Constitutional: He is oriented to person, place, and time. He appears well-developed and well-nourished. No distress.  HENT:  Head: Normocephalic and atraumatic.  Right Ear: Tympanic membrane, external ear and ear canal normal.  Left Ear: Tympanic membrane, external ear and ear canal normal.  Mouth/Throat: Oropharynx is clear and moist and mucous membranes are normal.  Eyes: Conjunctivae and EOM are normal. Pupils are equal, round, and reactive to light.  Neck: Normal range of motion. No tracheal deviation present. Thyromegaly present.  Cardiovascular: Normal rate and regular rhythm.  No murmur heard. Pulses:      Dorsalis pedis pulses are 2+ on the right side, and 2+ on the left side.  Respiratory: Effort normal and breath sounds normal. No respiratory distress.  GI: Soft. He exhibits no mass. There is no hepatomegaly. There is no tenderness.  Genitourinary:  Genitourinary Comments: Refused,no concerns.  Musculoskeletal: He exhibits no edema or tenderness.  No major deformities appreciated and no signs of synovitis.  Lymphadenopathy:    He has no  cervical adenopathy.       Right: No supraclavicular adenopathy present.       Left: No supraclavicular adenopathy present.  Neurological: He is alert and oriented to person, place, and time. He has normal strength. No cranial nerve deficit or sensory deficit. Coordination and gait normal.  Bicep and patellar DTR's 1-2+ symmetric.  Skin: Skin is warm. No erythema.  Psychiatric: He has a normal mood and affect.     ASSESSMENT AND PLAN:  Mr. Talib was seen today for annual exam.  Diagnoses and all orders for this visit:  Routine general medical examination at a health care facility  We discussed the importance of regular physical activity and healthy diet for prevention of chronic illness and/or complications. Preventive guidelines reviewed. Vaccination up to date  Advised caution with keto diet, > 80 ketone in urine. We will repeat urine today. Next CPE in a year.   The 10-year ASCVD risk score Mikey Bussing DC Jr., et al., 2013) is: 3.9%   Values  used to calculate the score:     Age: 80 years     Sex: Male     Is Non-Hispanic African American: No     Diabetic: No     Tobacco smoker: No     Systolic Blood Pressure: 458 mmHg     Is BP treated: No     HDL Cholesterol: 37.3 mg/dL     Total Cholesterol: 222 mg/dL   Circadian rhythm sleep disorder He will continue Ambien 5 mg as needed when traveling to [place], which happens about 4-5 times per year. I think it is appropriate to follow annually.  Thyroglossal duct cyst Asymptomatic. Thyroid ultrasound will be arranged and further recommendations will be given accordingly.  Hyperlipidemia Mildly worse when compared with lipid panel done last year. Because still his 10 years CVD risk is still low (3.9%), I think he can continue nonpharmacologic treatment. I recommend trying to 2-4 oz of wine a few times per week to help improving HDL. Follow-up in 1 year.    Return in about 1 year (around 03/15/2018).    Isatou Agredano G. Martinique,  MD  [facility]. [place] office.

## 2017-03-14 ENCOUNTER — Ambulatory Visit (INDEPENDENT_AMBULATORY_CARE_PROVIDER_SITE_OTHER): Payer: 59 | Admitting: Family Medicine

## 2017-03-14 ENCOUNTER — Encounter: Payer: Self-pay | Admitting: Family Medicine

## 2017-03-14 ENCOUNTER — Telehealth: Payer: Self-pay | Admitting: Family Medicine

## 2017-03-14 VITALS — BP 116/82 | HR 71 | Temp 97.6°F | Resp 12 | Ht 68.03 in | Wt 164.4 lb

## 2017-03-14 DIAGNOSIS — Z0000 Encounter for general adult medical examination without abnormal findings: Secondary | ICD-10-CM | POA: Diagnosis not present

## 2017-03-14 DIAGNOSIS — Z131 Encounter for screening for diabetes mellitus: Secondary | ICD-10-CM

## 2017-03-14 DIAGNOSIS — G4720 Circadian rhythm sleep disorder, unspecified type: Secondary | ICD-10-CM | POA: Diagnosis not present

## 2017-03-14 DIAGNOSIS — Q892 Congenital malformations of other endocrine glands: Secondary | ICD-10-CM | POA: Diagnosis not present

## 2017-03-14 DIAGNOSIS — E785 Hyperlipidemia, unspecified: Secondary | ICD-10-CM

## 2017-03-14 DIAGNOSIS — N401 Enlarged prostate with lower urinary tract symptoms: Secondary | ICD-10-CM | POA: Diagnosis not present

## 2017-03-14 DIAGNOSIS — R8290 Unspecified abnormal findings in urine: Secondary | ICD-10-CM

## 2017-03-14 DIAGNOSIS — R350 Frequency of micturition: Secondary | ICD-10-CM | POA: Diagnosis not present

## 2017-03-14 LAB — URINALYSIS, ROUTINE W REFLEX MICROSCOPIC
KETONES UR: NEGATIVE
Leukocytes, UA: NEGATIVE
NITRITE: NEGATIVE
SPECIFIC GRAVITY, URINE: 1.02 (ref 1.000–1.030)
Total Protein, Urine: NEGATIVE
URINE GLUCOSE: NEGATIVE
UROBILINOGEN UA: 0.2 (ref 0.0–1.0)
pH: 6 (ref 5.0–8.0)

## 2017-03-14 MED ORDER — ZOLPIDEM TARTRATE 5 MG PO TABS
5.0000 mg | ORAL_TABLET | Freq: Every evening | ORAL | 1 refills | Status: DC | PRN
Start: 2017-03-14 — End: 2018-03-24

## 2017-03-14 NOTE — Assessment & Plan Note (Signed)
He will continue Ambien 5 mg as needed when traveling to [place], which happens about 4-5 times per year. I think it is appropriate to follow annually.

## 2017-03-14 NOTE — Patient Instructions (Signed)
  At least 150 minutes of moderate exercise per week, daily brisk walking for 15-30 min is a good exercise option. Healthy diet low in saturated (animal) fats and sweets and consisting of fresh fruits and vegetables, lean meats such as fish and white chicken and whole grains.  - Vaccines:  Tdap vaccine every 10 years.  Shingles vaccine recommended at age 49, could be given after 49 years of age but not sure about insurance coverage.  Pneumonia vaccines:  Prevnar 28 at 29 and Pneumovax at 26.   -Screening recommendations for low/normal risk males:  Screening for diabetes at age 51 and every 3 years. Earlier screening if cardiovascular risk factors.   Lipid screening at 35 and every 3 years. Screening starts in younger males with cardiovascular risk factors.  Colon cancer screening at age 8 and until age 32.  Prostate cancer screening: some controversy, starts usually at 33: Rectal exam and PSA.  Aortic Abdominal Aneurism once between 1 and 25 years old if ever smoker.  Also recommended:  1. Dental visit- Brush and floss your teeth twice daily; visit your dentist twice a year. 2. Eye doctor- Get an eye exam at least every 2 years. 3. Helmet use- Always wear a helmet when riding a bicycle, motorcycle, rollerblading or skateboarding. 4. Safe sex- If you may be exposed to sexually transmitted infections, use a condom. 5. Seat belts- Seat belts can save your live; always wear one. 6. Smoke/Carbon Monoxide detectors- These detectors need to be installed on the appropriate level of your home. Replace batteries at least once a year. 7. Skin cancer- When out in the sun please cover up and use sunscreen 15 SPF or higher. 8. Violence- If anyone is threatening or hurting you, please tell your healthcare provider.  9. Drink alcohol in moderation- Limit alcohol intake to one drink or less per day. Never drink and drive.

## 2017-03-14 NOTE — Telephone Encounter (Signed)
Forms given to Dr. Martinique, will fax and call patient when completed.

## 2017-03-14 NOTE — Telephone Encounter (Signed)
Spoke with patient, informed that fax number wasn't working and that his form was ready for pick up. Patient verbalized understanding.

## 2017-03-14 NOTE — Assessment & Plan Note (Signed)
Asymptomatic. Thyroid ultrasound will be arranged and further recommendations will be given accordingly.

## 2017-03-14 NOTE — Assessment & Plan Note (Signed)
Mildly worse when compared with lipid panel done last year. Because still his 10 years CVD risk is still low (3.9%), I think he can continue nonpharmacologic treatment. I recommend trying to 2-4 oz of wine a few times per week to help improving HDL. Follow-up in 1 year.

## 2017-03-14 NOTE — Telephone Encounter (Signed)
Medical Evaluation Form to be filled out.  Patient saw Dr Martinique this morning and forgot to have form filled out.  Fax to [phone], attn: Blanca Friend upon completion. Also call [phone] for pick up.  Placed in dr's folder.

## 2017-03-15 ENCOUNTER — Encounter: Payer: Self-pay | Admitting: Family Medicine

## 2018-03-13 ENCOUNTER — Ambulatory Visit (INDEPENDENT_AMBULATORY_CARE_PROVIDER_SITE_OTHER): Payer: 59 | Admitting: Family Medicine

## 2018-03-13 ENCOUNTER — Encounter: Payer: Self-pay | Admitting: Family Medicine

## 2018-03-13 VITALS — BP 118/80 | HR 74 | Temp 97.5°F | Resp 12 | Ht 68.11 in | Wt 164.3 lb

## 2018-03-13 DIAGNOSIS — Z130 Encounter for screening for diseases of the blood and blood-forming organs and certain disorders involving the immune mechanism: Secondary | ICD-10-CM

## 2018-03-13 DIAGNOSIS — N401 Enlarged prostate with lower urinary tract symptoms: Secondary | ICD-10-CM

## 2018-03-13 DIAGNOSIS — Q892 Congenital malformations of other endocrine glands: Secondary | ICD-10-CM

## 2018-03-13 DIAGNOSIS — R350 Frequency of micturition: Secondary | ICD-10-CM

## 2018-03-13 DIAGNOSIS — E785 Hyperlipidemia, unspecified: Secondary | ICD-10-CM

## 2018-03-13 DIAGNOSIS — Z23 Encounter for immunization: Secondary | ICD-10-CM

## 2018-03-13 DIAGNOSIS — Z13228 Encounter for screening for other metabolic disorders: Secondary | ICD-10-CM | POA: Diagnosis not present

## 2018-03-13 DIAGNOSIS — Z125 Encounter for screening for malignant neoplasm of prostate: Secondary | ICD-10-CM

## 2018-03-13 DIAGNOSIS — Z1329 Encounter for screening for other suspected endocrine disorder: Secondary | ICD-10-CM

## 2018-03-13 DIAGNOSIS — Z0000 Encounter for general adult medical examination without abnormal findings: Secondary | ICD-10-CM | POA: Diagnosis not present

## 2018-03-13 DIAGNOSIS — Z1211 Encounter for screening for malignant neoplasm of colon: Secondary | ICD-10-CM

## 2018-03-13 DIAGNOSIS — G4720 Circadian rhythm sleep disorder, unspecified type: Secondary | ICD-10-CM

## 2018-03-13 LAB — LIPID PANEL
CHOL/HDL RATIO: 4
Cholesterol: 225 mg/dL — ABNORMAL HIGH (ref 0–200)
HDL: 61.3 mg/dL (ref >39.00)
LDL Cholesterol: 149 mg/dL — ABNORMAL HIGH (ref 0–99)
NONHDL: 163.4
Triglycerides: 72 mg/dL (ref 0.0–149.0)
VLDL: 14.4 mg/dL (ref 0.0–40.0)

## 2018-03-13 LAB — URINALYSIS, ROUTINE W REFLEX MICROSCOPIC
Bilirubin Urine: NEGATIVE
Ketones, ur: NEGATIVE
Leukocytes,Ua: NEGATIVE
Nitrite: NEGATIVE
PH: 5.5 (ref 5.0–8.0)
Specific Gravity, Urine: 1.025 (ref 1.000–1.030)
Total Protein, Urine: NEGATIVE
Urine Glucose: NEGATIVE
Urobilinogen, UA: 0.2 (ref 0.0–1.0)

## 2018-03-13 LAB — COMPREHENSIVE METABOLIC PANEL
ALK PHOS: 53 U/L (ref 39–117)
ALT: 11 U/L (ref 0–53)
AST: 13 U/L (ref 0–37)
Albumin: 5.1 g/dL (ref 3.5–5.2)
BUN: 12 mg/dL (ref 6–23)
CO2: 30 meq/L (ref 19–32)
Calcium: 9.9 mg/dL (ref 8.4–10.5)
Chloride: 100 mEq/L (ref 96–112)
Creatinine, Ser: 1.05 mg/dL (ref 0.40–1.50)
GFR: 74.89 mL/min (ref >60.00)
GLUCOSE: 85 mg/dL (ref 70–99)
POTASSIUM: 4.3 meq/L (ref 3.5–5.1)
Sodium: 139 mEq/L (ref 135–145)
Total Bilirubin: 1.8 mg/dL — ABNORMAL HIGH (ref 0.2–1.2)
Total Protein: 7.4 g/dL (ref 6.0–8.3)

## 2018-03-13 LAB — TSH: TSH: 1.54 u[IU]/mL (ref 0.35–4.50)

## 2018-03-13 LAB — PSA: PSA: 0.95 ng/mL (ref 0.10–4.00)

## 2018-03-13 NOTE — Assessment & Plan Note (Signed)
Continue nonpharmacologic treatment. Further recommendation will be given according to 10 years CVD risk and lipid panel results.

## 2018-03-13 NOTE — Patient Instructions (Addendum)
A few things to remember from today's visit:   Routine general medical examination at a health care facility  Hyperlipidemia, unspecified hyperlipidemia type - Plan: Lipid panel  Thyroglossal duct cyst - Plan: TSH, US THYROID  Circadian rhythm sleep disorder  Diabetes mellitus screening - Plan: Comprehensive metabolic panel  Benign prostatic hyperplasia with urinary frequency - Plan: PSA, Urinalysis, Routine w reflex microscopic  Colon cancer screening - Plan: Ambulatory referral to Gastroenterology  Screening for endocrine, metabolic and immunity disorder   At least 150 minutes of moderate exercise per week, daily brisk walking for 15-30 min is a good exercise option. Healthy diet low in saturated (animal) fats and sweets and consisting of fresh fruits and vegetables, lean meats such as fish and white chicken and whole grains.  - Vaccines:  Tdap vaccine every 10 years.  Shingles vaccine recommended at age 56, could be given after 51 years of age but not sure about insurance coverage.  Pneumonia vaccines:  Prevnar 61 at 64 and Pneumovax at 33.   -Screening recommendations for low/normal risk males:  Screening for diabetes at age 15 and every 3 years. Earlier screening if cardiovascular risk factors.  Colon cancer screening at age 16 and until age 43.  Prostate cancer screening: some controversy, starts usually at 32: Rectal exam and PSA.  Aortic Abdominal Aneurism once between 40 and 64 years old if ever smoker.  Also recommended:  1. Dental visit- Brush and floss your teeth twice daily; visit your dentist twice a year. 2. Eye doctor- Get an eye exam at least every 2 years. 3. Helmet use- Always wear a helmet when riding a bicycle, motorcycle, rollerblading or skateboarding. 4. Safe sex- If you may be exposed to sexually transmitted infections, use a condom. 5. Seat belts- Seat belts can save your live; always wear one. 6. Smoke/Carbon Monoxide detectors- These  detectors need to be installed on the appropriate level of your home. Replace batteries at least once a year. 7. Skin cancer- When out in the sun please cover up and use sunscreen 15 SPF or higher. 8. Violence- If anyone is threatening or hurting you, please tell your healthcare provider.  9. Drink alcohol in moderation- Limit alcohol intake to one drink or less per day. Never drink and drive.  Please be sure medication list is accurate. If a new problem present, please set up appointment sooner than planned today.

## 2018-03-13 NOTE — Progress Notes (Signed)
HPI:  [name] is a 48 y.o.male here today for follow up on some chronic problems and for his routine physical examination.  Last CPE: 03/14/17. He lives with his wife and 2 children.  Regular exercise 3 or more times per week: Yes, 3-4 times per week. Following a healthy diet: Yes,stopped keto diet, he is eating some carbs.   Chronic medical problems: GERD,HLD,thyroglossal cyst, urinary frequency,and seasonal allergies.  Immunization History  Administered Date(s) Administered  . Influenza,inj,Quad PF,6+ Mos 10/09/2013  . Influenza-Unspecified 10/07/2011, 10/18/2015  . Tdap 03/13/2018    -Denies high alcohol intake, tobacco use, or Hx of illicit drug use.  -Concerns and/or follow up today:   Last year future labs and thyroid US order were placed, not done.  HLD, he is on non pharmacologic treatment. Last FLP 03/10/2017 TC 222,TG 106,HDL 37,and LDL 163. He has not been interested in pharmacologic treatment.  Thyroglossal cyst: Denies dysphagia or neck mass. Last TSH normal at 1.6.  Last thyroid US 03/2015.  Circadian rhythm sleep disorder: He travels to [place] a few times per year,takes Ambien as needed while flying. No side effects reported and med still helps.  BPH: He is on non pharmacologic treatment. He decreased fluid intake around bed time and this has helped.  Urinary frequency,stable. Denies dysuria,increased nocturia, gross hematuria,or decreased urine output.   Review of Systems  Constitutional: Negative for activity change, appetite change, fatigue, fever and unexpected weight change.  HENT: Negative for dental problem, nosebleeds, sore throat, trouble swallowing and voice change.   Eyes: Negative for redness and visual disturbance.  Respiratory: Negative for cough, shortness of breath and wheezing.   Cardiovascular: Negative for chest pain, palpitations and leg swelling.  Gastrointestinal: Negative for abdominal pain, blood in  stool, nausea and vomiting.  Endocrine: Negative for polydipsia, polyphagia and polyuria.  Genitourinary: Negative for decreased urine volume, dysuria, genital sores, hematuria and testicular pain.  Musculoskeletal: Negative for gait problem and myalgias.  Skin: Negative for color change and rash.  Allergic/Immunologic: Positive for environmental allergies.  Neurological: Negative for syncope, weakness and headaches.  Hematological: Negative for adenopathy. Does not bruise/bleed easily.  Psychiatric/Behavioral: Positive for sleep disturbance. Negative for confusion. The patient is not nervous/anxious.      Current Outpatient Medications on File Prior to Visit  Medication Sig Dispense Refill  . cetirizine (ZYRTEC) 10 MG tablet Take 10 mg by mouth daily.    . hydrocortisone (ANUSOL-HC) 2.5 % rectal cream Place 1 application rectally 2 (two) times daily as needed for hemorrhoids or itching. 30 g 1  . zolpidem (AMBIEN) 5 MG tablet Take 1 tablet (5 mg total) by mouth at bedtime as needed for sleep. 30 tablet 1   No current facility-administered medications on file prior to visit.      Past Medical History:  Diagnosis Date  . Allergic rhinitis   . Allergy   . Chicken pox   . Hypercholesteremia   . Thyroglossal duct cyst    Followed with ENT last in 2015    Past Surgical History:  Procedure Laterality Date  . EYE SURGERY     Corneal Lasik,bilateral;01/2001  . INGUINAL HERNIA REPAIR Bilateral 1992  . MYRINGOPLASTY     at age 1.  . REFRACTIVE SURGERY  2005    No Known Allergies  Family History  Problem Relation Age of Onset  . Heart disease Maternal Grandmother   . Hypertension Maternal Grandmother   . Stroke Maternal Grandfather   .  Cancer Paternal Grandmother        ? Brain  . Heart disease Paternal Grandfather        MI    Social History   Socioeconomic History  . Marital status: Married    Spouse name: Not on file  . Number of children: Not on file  . Years of  education: Not on file  . Highest education level: Not on file  Occupational History  . Not on file  Social Needs  . Financial resource strain: Not on file  . Food insecurity:    Worry: Not on file    Inability: Not on file  . Transportation needs:    Medical: Not on file    Non-medical: Not on file  Tobacco Use  . Smoking status: Never Smoker  . Smokeless tobacco: Never Used  Substance and Sexual Activity  . Alcohol use: No  . Drug use: No  . Sexual activity: Yes  Lifestyle  . Physical activity:    Days per week: Not on file    Minutes per session: Not on file  . Stress: Not on file  Relationships  . Social connections:    Talks on phone: Not on file    Gets together: Not on file    Attends religious service: Not on file    Active member of club or organization: Not on file    Attends meetings of clubs or organizations: Not on file    Relationship status: Not on file  Other Topics Concern  . Not on file  Social History Narrative   The patient is married with 2 children, ages 2 and 79. He is a lifetime nonsmoker. He drinks alcohol only on rare occasions. He works as a Network engineer at [employer].     Vitals:   03/13/18 0932  BP: 118/80  Pulse: 74  Resp: 12  Temp: (!) 97.5 F (36.4 C)  SpO2: 97%   Body mass index is 24.9 kg/m.   Wt Readings from Last 3 Encounters:  03/13/18 164 lb 5 oz (74.5 kg)  03/14/17 164 lb 6 oz (74.6 kg)  03/08/16 161 lb 3.2 oz (73.1 kg)     Physical Exam  Nursing note and vitals reviewed. Constitutional: He is oriented to person, place, and time. He appears well-developed and well-nourished. No distress.  HENT:  Head: Normocephalic and atraumatic.  Right Ear: Tympanic membrane, external ear and ear canal normal.  Left Ear: Tympanic membrane, external ear and ear canal normal.  Mouth/Throat: Oropharynx is clear and moist and mucous membranes are normal.  Eyes: Pupils are equal, round, and reactive to light. Conjunctivae and EOM  are normal.  Neck: Normal range of motion. No tracheal deviation present. Thyromegaly present.  Cardiovascular: Normal rate and regular rhythm.  No murmur heard. Pulses:      Dorsalis pedis pulses are 2+ on the right side and 2+ on the left side.  Respiratory: Effort normal and breath sounds normal. No respiratory distress.  GI: Soft. He exhibits no mass. There is no hepatomegaly. There is no abdominal tenderness.  Genitourinary:    Genitourinary Comments: Refused,no concerns.   Musculoskeletal:        General: No tenderness or edema.     Comments: No major deformities appreciated and no signs of synovitis.  Lymphadenopathy:    He has no cervical adenopathy.       Right: No supraclavicular adenopathy present.       Left: No supraclavicular adenopathy present.  Neurological: He  is alert and oriented to person, place, and time. He has normal strength. No cranial nerve deficit or sensory deficit. Coordination and gait normal.  Reflex Scores:      Bicep reflexes are 2+ on the right side and 2+ on the left side.      Patellar reflexes are 2+ on the right side and 2+ on the left side. Skin: Skin is warm. No erythema.  Psychiatric: His mood appears anxious. Cognition and memory are normal.    ASSESSMENT AND PLAN:  Mr. Marton was seen today for annual exam.  Orders Placed This Encounter  Procedures  . US THYROID  . Tdap vaccine greater than or equal to 7yo IM  . Ambulatory referral to Gastroenterology   Lab Results  Component Value Date   CHOL 225 (H) 03/13/2018   HDL 61.30 03/13/2018   LDLCALC 149 (H) 03/13/2018   TRIG 72.0 03/13/2018   CHOLHDL 4 03/13/2018   Lab Results  Component Value Date   PSA 0.95 03/13/2018        Lab Results  Component Value Date   CREATININE 1.05 03/13/2018   BUN 12 03/13/2018   NA 139 03/13/2018   K 4.3 03/13/2018   CL 100 03/13/2018   CO2 30 03/13/2018   Lab Results  Component Value Date   ALT 11 03/13/2018   AST 13 03/13/2018    ALKPHOS 53 03/13/2018   BILITOT 1.8 (H) 03/13/2018   Lab Results  Component Value Date   TSH 1.54 03/13/2018     Routine general medical examination at a health care facility We discussed the importance of regular physical activity and healthy diet for prevention of chronic illness and/or complications. Preventive guidelines reviewed. Vaccination up to date.  Next CPE in a year.  The 10-year ASCVD risk score Mikey Bussing DC Brooke Bonito., et al., 2013) is: 2.6%   Values used to calculate the score:     Age: 67 years     Sex: Male     Is Non-Hispanic African American: No     Diabetic: No     Tobacco smoker: No     Systolic Blood Pressure: 680 mmHg     Is BP treated: No     HDL Cholesterol: 61.3 mg/dL     Total Cholesterol: 225 mg/dL  Benign prostatic hyperplasia with urinary frequency Symptoms stable. No pharmacologic treatment recommended for now.  -     PSA -     Urinalysis, Routine w reflex microscopic  Colon cancer screening -     Ambulatory referral to Gastroenterology  Screening for endocrine, metabolic and immunity disorder -     Comprehensive metabolic panel  Need for Tdap vaccination -     Tdap vaccine greater than or equal to 7yo IM  Circadian rhythm sleep disorder No changes in Ambien, which he takes just when traveling [place]. We discussed some side effects. Continue following annually.  Hyperlipidemia Continue nonpharmacologic treatment. Further recommendation will be given according to 10 years CVD risk and lipid panel results.  Thyroglossal duct cyst Asymptomatic. Thyroid US was not done, will place order again. Further recommendation will be given according to TSH/imaging results.     Return in 1 year (on 03/13/2019) for cpe.    Betty G. Martinique, MD  [facility]. [place] office.

## 2018-03-13 NOTE — Assessment & Plan Note (Signed)
No changes in Ambien, which he takes just when traveling [place]. We discussed some side effects. Continue following annually.

## 2018-03-13 NOTE — Assessment & Plan Note (Signed)
Asymptomatic. Thyroid US was not done, will place order again. Further recommendation will be given according to TSH/imaging results.

## 2018-03-16 ENCOUNTER — Encounter: Payer: Self-pay | Admitting: Family Medicine

## 2018-03-16 ENCOUNTER — Encounter: Payer: 59 | Admitting: Family Medicine

## 2018-03-16 NOTE — Telephone Encounter (Signed)
Called and spoke with pt to discuss lab results, answer questions, and to go through cholesterol guidelines/recommendations. His 10 year CVD rist 2.6%, so pharmacologic treatment is not recommended at this time. All questions answered, he is satisfied. We planned on repeating labs next CPE.  Elias Dennington Martinique, MD

## 2018-03-19 ENCOUNTER — Telehealth: Payer: Self-pay | Admitting: Family Medicine

## 2018-03-19 NOTE — Telephone Encounter (Signed)
Ambien refill request  Pt of Dr. Martinique

## 2018-03-19 NOTE — Telephone Encounter (Signed)
Copied from [place] [id number]. Topic: Quick Communication - Rx Refill/Question >> Mar 19, 2018  2:31 PM Bea Graff, NT wrote: Medication: zolpidem (AMBIEN) 5 MG tablet   Has the patient contacted their pharmacy? Yes.   (Agent: If no, request that the patient contact the pharmacy for the refill.) (Agent: If yes, when and what did the pharmacy advise?)  Preferred Pharmacy (with phone number or street name): [id number] - [place], [place] [phone] (Phone) [phone] (Fax)    Agent: Please be advised that RX refills may take up to 3 business days. We ask that you follow-up with your pharmacy.

## 2018-03-24 ENCOUNTER — Other Ambulatory Visit: Payer: Self-pay

## 2018-03-24 ENCOUNTER — Ambulatory Visit
Admission: RE | Admit: 2018-03-24 | Discharge: 2018-03-24 | Disposition: A | Discharge status: Home / Self Care | Payer: 59 | Source: Ambulatory Visit | Attending: Family Medicine | Admitting: Family Medicine

## 2018-03-24 ENCOUNTER — Other Ambulatory Visit: Payer: Self-pay | Admitting: Family Medicine

## 2018-03-24 DIAGNOSIS — Q892 Congenital malformations of other endocrine glands: Secondary | ICD-10-CM

## 2018-03-24 DIAGNOSIS — G4720 Circadian rhythm sleep disorder, unspecified type: Secondary | ICD-10-CM

## 2018-03-24 MED ORDER — ZOLPIDEM TARTRATE 5 MG PO TABS: 5.0000 mg | ORAL_TABLET | Freq: Every evening | ORAL | 1 refills | Status: DC | PRN

## 2018-03-24 NOTE — Telephone Encounter (Signed)
I think I send a prescription for Ambien to his pharmacy after his last visit.  Prescription for Ambien 5 mg to continue taking daily as needed was sent to his pharmacy.  Thanks, BJ

## 2018-03-24 NOTE — Telephone Encounter (Signed)
Message sent to Dr. Jordan for review and approval. 

## 2018-03-25 ENCOUNTER — Encounter: Payer: Self-pay | Admitting: Family Medicine

## 2018-04-20 ENCOUNTER — Encounter: Payer: Self-pay | Admitting: Family Medicine

## 2018-05-06 ENCOUNTER — Telehealth: Payer: 59 | Admitting: Family

## 2018-05-06 DIAGNOSIS — H00015 Hordeolum externum left lower eyelid: Secondary | ICD-10-CM | POA: Diagnosis not present

## 2018-05-06 MED ORDER — NEOMYCIN-POLYMYXIN-HC 3.5-10000-1 OP SUSP: 3.0000 [drp] | Freq: Four times a day (QID) | OPHTHALMIC | 0 refills | Status: DC

## 2018-05-06 NOTE — Progress Notes (Signed)
We are sorry that you are not feeling well. Here is how we plan to help!  Based on what you have shared with me it looks like you have a stye.  A stye is an inflammation of the eyelid.  It is often a red, painful lump near the edge of the eyelid that may look like a boil or a pimple.  A stye develops when an infection occurs at the base of an eyelash.   We have made appropriate suggestions for you based upon your presentation: Your symptoms may indicate an infection of the sclera.  The use of anti-inflammatory and antibiotic eye drops for a week will help resolve this condition.  I have sent in neomycin-polymyxin HC opthalmic suspension, two to three drops in the affected eye every 4 hours.  If your symptoms do not improve over the next two to three days you should be seen in your doctor's office.  Approximately 5 minutes was spent documenting and reviewing patient's chart.   HOME CARE:   Wash your hands often!  Let the stye open on its own. Don't squeeze or open it.  Don't rub your eyes. This can irritate your eyes and let in bacteria.  If you need to touch your eyes, wash your hands first.  Don't wear eye makeup or contact lenses until the area has healed.  GET HELP RIGHT AWAY IF:   Your symptoms do not improve.  You develop blurred or loss of vision.  Your symptoms worsen (increased discharge, pain or redness).  Thank you for choosing an e-visit.  Your e-visit answers were reviewed by a board certified advanced clinical practitioner to complete your personal care plan.  Depending upon the condition, your plan could have included both over the counter or prescription medications.  Please review your pharmacy choice.  Make sure the pharmacy is open so you can pick up prescription now.  If there is a problem, you may contact your provider through [employer] and have the prescription routed to another pharmacy.    Your safety is important to [place].  If you have drug allergies  check your prescription carefully.  For the next 24 hours you can use MyChart to ask questions about today's visit, request a non-urgent call back, or ask for a work or school excuse.  You will get an email in the next two days asking about your experience.  I hope you that your e-visit has been valuable and will speed your recovery.

## 2018-05-10 ENCOUNTER — Other Ambulatory Visit: Payer: Self-pay | Admitting: Family

## 2018-05-10 DIAGNOSIS — H00015 Hordeolum externum left lower eyelid: Secondary | ICD-10-CM

## 2018-05-10 MED ORDER — BACITRACIN 500 UNIT/GM OP OINT: 1.0000 "application " | TOPICAL_OINTMENT | Freq: Four times a day (QID) | OPHTHALMIC | 0 refills | Status: AC

## 2018-05-10 NOTE — Addendum Note (Signed)
Addended by: Evelina Dun A on: 05/10/2018 12:06 PM   Modules accepted: Orders

## 2018-05-12 MED ORDER — OFLOXACIN 0.3 % OP SOLN: OPHTHALMIC | 0 refills | Status: DC

## 2018-05-12 NOTE — Addendum Note (Signed)
Addended by: Tereasa Coop on: 05/12/2018 02:18 PM   Modules accepted: Orders

## 2018-09-25 ENCOUNTER — Encounter: Payer: Self-pay | Admitting: Family Medicine

## 2019-03-12 ENCOUNTER — Other Ambulatory Visit: Payer: Self-pay

## 2019-03-15 ENCOUNTER — Encounter: Payer: Self-pay | Admitting: Family Medicine

## 2019-03-15 ENCOUNTER — Other Ambulatory Visit: Payer: Self-pay

## 2019-03-15 ENCOUNTER — Ambulatory Visit (INDEPENDENT_AMBULATORY_CARE_PROVIDER_SITE_OTHER): Payer: 59 | Admitting: Family Medicine

## 2019-03-15 VITALS — BP 118/70 | HR 73 | Temp 96.6°F | Resp 12 | Ht 68.11 in | Wt 170.2 lb

## 2019-03-15 DIAGNOSIS — Z125 Encounter for screening for malignant neoplasm of prostate: Secondary | ICD-10-CM

## 2019-03-15 DIAGNOSIS — Q892 Congenital malformations of other endocrine glands: Secondary | ICD-10-CM

## 2019-03-15 DIAGNOSIS — K111 Hypertrophy of salivary gland: Secondary | ICD-10-CM | POA: Diagnosis not present

## 2019-03-15 DIAGNOSIS — Z130 Encounter for screening for diseases of the blood and blood-forming organs and certain disorders involving the immune mechanism: Secondary | ICD-10-CM

## 2019-03-15 DIAGNOSIS — Z1329 Encounter for screening for other suspected endocrine disorder: Secondary | ICD-10-CM

## 2019-03-15 DIAGNOSIS — R351 Nocturia: Secondary | ICD-10-CM

## 2019-03-15 DIAGNOSIS — N401 Enlarged prostate with lower urinary tract symptoms: Secondary | ICD-10-CM

## 2019-03-15 DIAGNOSIS — E785 Hyperlipidemia, unspecified: Secondary | ICD-10-CM | POA: Diagnosis not present

## 2019-03-15 DIAGNOSIS — G4720 Circadian rhythm sleep disorder, unspecified type: Secondary | ICD-10-CM | POA: Diagnosis not present

## 2019-03-15 DIAGNOSIS — Z0000 Encounter for general adult medical examination without abnormal findings: Secondary | ICD-10-CM

## 2019-03-15 DIAGNOSIS — Z13228 Encounter for screening for other metabolic disorders: Secondary | ICD-10-CM

## 2019-03-15 LAB — CBC WITH DIFFERENTIAL/PLATELET
Basophils Absolute: 0 10*3/uL (ref 0.0–0.1)
Basophils Relative: 0.5 % (ref 0.0–3.0)
Eosinophils Absolute: 0.2 10*3/uL (ref 0.0–0.7)
Eosinophils Relative: 3.6 % (ref 0.0–5.0)
HCT: 43.1 % (ref 39.0–52.0)
Hemoglobin: 14.7 g/dL (ref 13.0–17.0)
Lymphocytes Relative: 26.6 % (ref 12.0–46.0)
Lymphs Abs: 1.2 10*3/uL (ref 0.7–4.0)
MCHC: 34.1 g/dL (ref 30.0–36.0)
MCV: 94.6 fl (ref 78.0–100.0)
Monocytes Absolute: 0.5 10*3/uL (ref 0.1–1.0)
Monocytes Relative: 11.1 % (ref 3.0–12.0)
Neutro Abs: 2.5 10*3/uL (ref 1.4–7.7)
Neutrophils Relative %: 58.2 % (ref 43.0–77.0)
Platelets: 241 10*3/uL (ref 150.0–400.0)
RBC: 4.56 Mil/uL (ref 4.22–5.81)
RDW: 12.6 % (ref 11.5–15.5)
WBC: 4.3 10*3/uL (ref 4.0–10.5)

## 2019-03-15 LAB — LIPID PANEL
Cholesterol: 191 mg/dL (ref 0–200)
HDL: 47.4 mg/dL (ref >39.00)
LDL Cholesterol: 132 mg/dL — ABNORMAL HIGH (ref 0–99)
NonHDL: 143.53
Total CHOL/HDL Ratio: 4
Triglycerides: 56 mg/dL (ref 0.0–149.0)
VLDL: 11.2 mg/dL (ref 0.0–40.0)

## 2019-03-15 LAB — COMPREHENSIVE METABOLIC PANEL
ALT: 10 U/L (ref 0–53)
AST: 13 U/L (ref 0–37)
Albumin: 4.5 g/dL (ref 3.5–5.2)
Alkaline Phosphatase: 61 U/L (ref 39–117)
BUN: 14 mg/dL (ref 6–23)
CO2: 30 mEq/L (ref 19–32)
Calcium: 9.7 mg/dL (ref 8.4–10.5)
Chloride: 102 mEq/L (ref 96–112)
Creatinine, Ser: 0.98 mg/dL (ref 0.40–1.50)
GFR: 80.77 mL/min (ref >60.00)
Glucose, Bld: 98 mg/dL (ref 70–99)
Potassium: 4.9 mEq/L (ref 3.5–5.1)
Sodium: 138 mEq/L (ref 135–145)
Total Bilirubin: 1.3 mg/dL — ABNORMAL HIGH (ref 0.2–1.2)
Total Protein: 7 g/dL (ref 6.0–8.3)

## 2019-03-15 LAB — URINALYSIS, ROUTINE W REFLEX MICROSCOPIC
Bilirubin Urine: NEGATIVE
Ketones, ur: NEGATIVE
Leukocytes,Ua: NEGATIVE
Nitrite: NEGATIVE
Specific Gravity, Urine: 1.02 (ref 1.000–1.030)
Total Protein, Urine: NEGATIVE
Urine Glucose: NEGATIVE
Urobilinogen, UA: 0.2 (ref 0.0–1.0)
WBC, UA: NONE SEEN (ref 0–?)
pH: 5.5 (ref 5.0–8.0)

## 2019-03-15 LAB — PSA: PSA: 0.8 ng/mL (ref 0.10–4.00)

## 2019-03-15 LAB — TSH: TSH: 1.09 u[IU]/mL (ref 0.35–4.50)

## 2019-03-15 MED ORDER — ZOLPIDEM TARTRATE 5 MG PO TABS: 5.0000 mg | ORAL_TABLET | Freq: Every evening | ORAL | 1 refills | Status: AC | PRN

## 2019-03-15 NOTE — Assessment & Plan Note (Signed)
Continue nonpharmacologic treatment. Further recommendation will be given according to 10 years ASCVD risk score and lipid panel results.

## 2019-03-15 NOTE — Progress Notes (Signed)
HPI:  Cristian Heath is a 49 y.o.male here today for his routine physical examination.  Last CPE: 03/13/2018. He lives with his wife and 2 children.  Regular exercise 3 or more times per week: He is walking about 5 to 6 miles daily. Following a healthy diet: Yes. Sleeps about 8 hours.  Chronic medical problems: Hyperlipidemia, GERD, circadian rhythm sleep disorder, and thyroglossal duct cyst among some.  Immunization History  Administered Date(s) Administered  . Influenza,inj,Quad PF,6+ Mos 10/09/2013  . Influenza-Unspecified 10/07/2011, 10/18/2015  . Tdap 03/13/2018  He already received the first dose of COVID-19 vaccine.  Last colon cancer screening: GI referral was placed last visit, due to COVID-19 pandemia he was postponed.  He is planning on arranging appointment after he receives second dose of COVID-19 vaccine.  Last prostate ca screening:  Nocturia x2, stable. Lab Results  Component Value Date   PSA 0.95 03/13/2018   PSA 0.92 02/27/2015   -Negative for high alcohol intake, tobacco use, or Hx of illicit drug use.  -Concerns and/or follow up today:  Hyperlipidemia: Currently he is on low-fat diet. There is no history of CVD.  Lab Results  Component Value Date   CHOL 225 (H) 03/13/2018   HDL 61.30 03/13/2018   LDLCALC 149 (H) 03/13/2018   TRIG 72.0 03/13/2018   CHOLHDL 4 03/13/2018   Thyroglossal duct cyst: Thyroid US in 03/2018: Unremarkable sonographic survey of the thyroid.  Demonstration of thyroglossal duct cyst.  Lab Results  Component Value Date   TSH 1.54 03/13/2018   Today during examination I noted right submandibular gland slightly enlarged. He has not noted any change. No history of tobacco use. Negative for sore throat, dysphagia, fever, night sweats, or weight loss.  Review of Systems  Constitutional: Negative for activity change, appetite change and fatigue.  HENT: Negative for dental problem, mouth sores and nosebleeds.    Eyes: Negative for redness and visual disturbance.  Respiratory: Negative for cough, shortness of breath and wheezing.   Cardiovascular: Negative for chest pain, palpitations and leg swelling.  Gastrointestinal: Negative for abdominal pain, blood in stool, nausea and vomiting.  Endocrine: Negative for cold intolerance, heat intolerance, polydipsia, polyphagia and polyuria.  Genitourinary: Negative for decreased urine volume, dysuria, genital sores, hematuria and testicular pain.  Musculoskeletal: Positive for arthralgias (Right shoulder, intermittently.). Negative for gait problem and joint swelling.  Skin: Negative for color change and rash.  Allergic/Immunologic: Positive for environmental allergies.  Neurological: Negative for syncope, weakness and headaches.  Hematological: Negative for adenopathy. Does not bruise/bleed easily.  Psychiatric/Behavioral: Negative for confusion and sleep disturbance. The patient is not nervous/anxious.   All other systems reviewed and are negative.  Current Outpatient Medications on File Prior to Visit  Medication Sig Dispense Refill  . cetirizine (ZYRTEC) 10 MG tablet Take 10 mg by mouth daily.     No current facility-administered medications on file prior to visit.   Past Medical History:  Diagnosis Date  . Allergic rhinitis   . Allergy   . Chicken pox   . Hypercholesteremia   . Thyroglossal duct cyst    Followed with ENT last in 2015    Past Surgical History:  Procedure Laterality Date  . EYE SURGERY     Corneal Lasik,bilateral;01/2001  . INGUINAL HERNIA REPAIR Bilateral 1992  . MYRINGOPLASTY     at age 66.  . REFRACTIVE SURGERY  2005    No Known Allergies  Family History  Problem Relation Age of Onset  .  Heart disease Maternal Grandmother   . Hypertension Maternal Grandmother   . Stroke Maternal Grandfather   . Cancer Paternal Grandmother        ? Brain  . Heart disease Paternal Grandfather        MI    Social History    Socioeconomic History  . Marital status: Married    Spouse name: Not on file  . Number of children: Not on file  . Years of education: Not on file  . Highest education level: Not on file  Occupational History  . Not on file  Tobacco Use  . Smoking status: Never Smoker  . Smokeless tobacco: Never Used  Substance and Sexual Activity  . Alcohol use: No  . Drug use: No  . Sexual activity: Yes  Other Topics Concern  . Not on file  Social History Narrative   The patient is married with 2 children, ages 75 and 24. He is a lifetime nonsmoker. He drinks alcohol only on rare occasions. He works as a Network engineer at [employer].   Social Determinants of Health   Financial Resource Strain:   . Difficulty of Paying Living Expenses: Not on file  Food Insecurity:   . Worried About Charity fundraiser in the Last Year: Not on file  . Ran Out of Food in the Last Year: Not on file  Transportation Needs:   . Lack of Transportation (Medical): Not on file  . Lack of Transportation (Non-Medical): Not on file  Physical Activity:   . Days of Exercise per Week: Not on file  . Minutes of Exercise per Session: Not on file  Stress:   . Feeling of Stress : Not on file  Social Connections:   . Frequency of Communication with Friends and Family: Not on file  . Frequency of Social Gatherings with Friends and Family: Not on file  . Attends Religious Services: Not on file  . Active Member of Clubs or Organizations: Not on file  . Attends Archivist Meetings: Not on file  . Marital Status: Not on file   Vitals:   03/15/19 0757  BP: 118/70  Pulse: 73  Resp: 12  Temp: (!) 96.6 F (35.9 C)  SpO2: 97%   Body mass index is 25.8 kg/m.  Wt Readings from Last 3 Encounters:  03/15/19 170 lb 4 oz (77.2 kg)  03/13/18 164 lb 5 oz (74.5 kg)  03/14/17 164 lb 6 oz (74.6 kg)   Physical Exam  Nursing note and vitals reviewed. Constitutional: He is oriented to person, place, and time. He appears  well-developed and well-nourished. No distress.  HENT:  Head: Normocephalic and atraumatic.  Right Ear: Tympanic membrane, external ear and ear canal normal.  Left Ear: Tympanic membrane, external ear and ear canal normal.  Mouth/Throat: Oropharynx is clear and moist and mucous membranes are normal.  Eyes: Pupils are equal, round, and reactive to light. Conjunctivae and EOM are normal.  Neck: No tracheal deviation present. No thyromegaly present.  Cardiovascular: Normal rate and regular rhythm.  No murmur heard. Pulses:      Dorsalis pedis pulses are 2+ on the right side and 2+ on the left side.  Respiratory: Effort normal and breath sounds normal. No respiratory distress.  GI: Soft. He exhibits no mass. There is no hepatomegaly. There is no abdominal tenderness.  Genitourinary:    Genitourinary Comments: No concerns.   Musculoskeletal:        General: No tenderness or edema.  Cervical back: Normal range of motion.     Comments: No major deformities appreciated and no signs of synovitis.  Lymphadenopathy:       Head (right side): No preauricular and no posterior auricular adenopathy present.       Head (left side): No preauricular and no posterior auricular adenopathy present.    He has no cervical adenopathy.       Right: No supraclavicular adenopathy present.       Left: No supraclavicular adenopathy present.  Right submandibular gland seems to be slightly bigger.  There is no tenderness, skin changes, or oral lesions.  Neurological: He is alert and oriented to person, place, and time. He has normal strength. No cranial nerve deficit or sensory deficit. Coordination and gait normal.  Reflex Scores:      Bicep reflexes are 2+ on the right side and 2+ on the left side.      Patellar reflexes are 2+ on the right side and 2+ on the left side. Skin: Skin is warm. No erythema.  Psychiatric: He has a normal mood and affect. Cognition and memory are normal.  Well-groomed, good eye  contact.   ASSESSMENT AND PLAN:  CristianSanjuan was seen today for annual exam.  Diagnoses and all orders for this visit:  Orders Placed This Encounter  Procedures  . Comprehensive metabolic panel  . Lipid panel  . TSH  . CBC with Differential  . PSA(Must document that pt has been informed of limitations of PSA testing.)  . Urinalysis, Routine w reflex microscopic   Lab Results  Component Value Date   CHOL 191 03/15/2019   HDL 47.40 03/15/2019   LDLCALC 132 (H) 03/15/2019   TRIG 56.0 03/15/2019   CHOLHDL 4 03/15/2019   Lab Results  Component Value Date   PSA 0.80 03/15/2019   PSA 0.95 03/13/2018   PSA 0.92 02/27/2015    Lab Results  Component Value Date   TSH 1.09 03/15/2019   Lab Results  Component Value Date   CREATININE 0.98 03/15/2019   BUN 14 03/15/2019   NA 138 03/15/2019   K 4.9 03/15/2019   CL 102 03/15/2019   CO2 30 03/15/2019   Lab Results  Component Value Date   ALT 10 03/15/2019   AST 13 03/15/2019   ALKPHOS 61 03/15/2019   BILITOT 1.3 (H) 03/15/2019   Lab Results  Component Value Date   WBC 4.3 03/15/2019   HGB 14.7 03/15/2019   HCT 43.1 03/15/2019   MCV 94.6 03/15/2019   PLT 241.0 03/15/2019    Routine general medical examination at a health care facility We discussed the importance of regular physical activity and healthy diet for prevention of chronic illness and/or complications. Preventive guidelines reviewed. Vaccination up-to-date. He will schedule colonoscopy after completing COVID-19 vaccination. Next CPE in a year.  The 10-year ASCVD risk score Mikey Bussing DC Brooke Bonito., et al., 2013) is: 3%   Values used to calculate the score:     Age: 68 years     Sex: Male     Is Non-Hispanic African American: No     Diabetic: No     Tobacco smoker: No     Systolic Blood Pressure: 123456 mmHg     Is BP treated: No     HDL Cholesterol: 47.4 mg/dL     Total Cholesterol: 191 mg/dL  Prostate cancer screening -     PSA(Must document that pt has been  informed of limitations of PSA testing.)  Submandibular gland hypertrophy  Asymptomatic. Slightly enlarged, not tender. Now I recommend monitoring for changes. Further recommendation will be given according to CBC results. He voices understanding and agrees with plan. -     CBC with Differential  Screening for endocrine, metabolic and immunity disorder -     Comprehensive metabolic panel  Benign prostatic hyperplasia with nocturia -     Urinalysis, Routine w reflex microscopic  Circadian rhythm sleep disorder Continue Ambien 5 mg at bedtime as needed for [place] sleep [place].  Hyperlipidemia Continue nonpharmacologic treatment. Further recommendation will be given according to 10 years ASCVD risk score and lipid panel results.  Thyroglossal duct cyst Problem has been stable. Further recommendation will be given according to TSH results.  Return in 1 year (on 03/14/2020) for cpe.    Bertie Mcconathy G. Martin[place], MD  [facility]. [place] office.

## 2019-03-15 NOTE — Patient Instructions (Addendum)
A few things to remember from today's visit:   Routine general medical examination at a health care facility  Circadian rhythm sleep disorder - Plan: zolpidem (AMBIEN) 5 MG tablet  Hyperlipidemia, unspecified hyperlipidemia type - Plan: Lipid panel  Thyroglossal duct cyst - Plan: TSH  Prostate cancer screening - Plan: PSA(Must document that pt has been informed of limitations of PSA testing.)  Submandibular gland hypertrophy - Plan: CBC with Differential  Screening for endocrine, metabolic and immunity disorder - Plan: Comprehensive metabolic panel  Right submandibular gland slightly enlarged. It does not seem worrisome but continue monitoring for changes. Remember to schedule your colonoscopy.  At least 150 minutes of moderate exercise per week, daily brisk walking for 15-30 min is a good exercise option. Healthy diet low in saturated (animal) fats and sweets and consisting of fresh fruits and vegetables, lean meats such as fish and white chicken and whole grains.  - Vaccines:  Tdap vaccine every 10 years.  Shingles vaccine recommended at age 70, could be given after 51 years of age but not sure about insurance coverage.  Pneumonia vaccines:  Prevnar 14 at 22 and Pneumovax at 26.   -Screening recommendations for low/normal risk males:  Screening for diabetes at age 53 and every 3 years. Earlier screening if cardiovascular risk factors.   Lipid screening at 35 and every 3 years. N/A  Colon cancer screening at age 85 and until age 1.  Prostate cancer screening: some controversy, starts usually at 74: Rectal exam and PSA.  Aortic Abdominal Aneurism once between 40 and 45 years old if ever smoker.  Also recommended:  1. Dental visit- Brush and floss your teeth twice daily; visit your dentist twice a year. 2. Eye doctor- Get an eye exam at least every 2 years. 3. Helmet use- Always wear a helmet when riding a bicycle, motorcycle, rollerblading or skateboarding. 4. Safe  sex- If you may be exposed to sexually transmitted infections, use a condom. 5. Seat belts- Seat belts can save your live; always wear one. 6. Smoke/Carbon Monoxide detectors- These detectors need to be installed on the appropriate level of your home. Replace batteries at least once a year. 7. Skin cancer- When out in the sun please cover up and use sunscreen 15 SPF or higher. 8. Violence- If anyone is threatening or hurting you, please tell your healthcare provider.  9. Drink alcohol in moderation- Limit alcohol intake to one drink or less per day. Never drink and drive.  Please be sure medication list is accurate. If a new problem present, please set up appointment sooner than planned today.

## 2019-03-15 NOTE — Assessment & Plan Note (Signed)
Continue Ambien 5 mg at bedtime as needed for [place] sleep when [place].

## 2019-03-15 NOTE — Assessment & Plan Note (Signed)
Problem has been stable. Further recommendation will be given according to TSH results.

## 2019-05-02 ENCOUNTER — Encounter: Payer: Self-pay | Admitting: Family Medicine

## 2019-05-03 ENCOUNTER — Encounter: Payer: Self-pay | Admitting: Gastroenterology

## 2019-05-12 ENCOUNTER — Telehealth: Payer: Self-pay | Admitting: Family Medicine

## 2019-05-12 ENCOUNTER — Other Ambulatory Visit (INDEPENDENT_AMBULATORY_CARE_PROVIDER_SITE_OTHER): Payer: 59

## 2019-05-12 ENCOUNTER — Other Ambulatory Visit: Payer: Self-pay | Admitting: *Deleted

## 2019-05-12 ENCOUNTER — Other Ambulatory Visit: Payer: Self-pay

## 2019-05-12 DIAGNOSIS — Z111 Encounter for screening for respiratory tuberculosis: Secondary | ICD-10-CM | POA: Diagnosis not present

## 2019-05-12 NOTE — Telephone Encounter (Signed)
Lab appointment scheduled for TB blood test.

## 2019-05-12 NOTE — Telephone Encounter (Signed)
Pt is requesting to have TB testing done, please follow up with pt to get scheduled.

## 2019-05-14 LAB — QUANTIFERON-TB GOLD PLUS
Mitogen-NIL: >10 IU/mL
NIL: 0.03 IU/mL
QuantiFERON-TB Gold Plus: NEGATIVE
TB1-NIL: 0 IU/mL
TB2-NIL: 0.01 IU/mL

## 2019-05-17 NOTE — Telephone Encounter (Signed)
Patient picked up form

## 2019-05-19 DIAGNOSIS — K219 Gastro-esophageal reflux disease without esophagitis: Secondary | ICD-10-CM | POA: Insufficient documentation

## 2019-05-19 DIAGNOSIS — K649 Unspecified hemorrhoids: Secondary | ICD-10-CM | POA: Insufficient documentation

## 2019-06-01 ENCOUNTER — Other Ambulatory Visit: Payer: Self-pay

## 2019-06-01 ENCOUNTER — Ambulatory Visit (AMBULATORY_SURGERY_CENTER): Payer: Self-pay | Admitting: *Deleted

## 2019-06-01 VITALS — Ht 68.0 in | Wt 174.0 lb

## 2019-06-01 DIAGNOSIS — Z1211 Encounter for screening for malignant neoplasm of colon: Secondary | ICD-10-CM

## 2019-06-01 NOTE — Progress Notes (Signed)
No egg or soy allergy known to patient  No issues with past sedation with any surgeries  or procedures, no intubation problems  No diet pills per patient No home 02 use per patient  No blood thinners per patient  Pt denies issues with constipation  No A fib or A flutter  EMMI video sent to pt's e mail   04-09-19 comp covid vaccines     Due to the COVID-19 pandemic we are asking patients to follow these guidelines. Please only bring one care partner. Please be aware that your care partner may wait in the car in the parking lot or if they feel like they will be too hot to wait in the car, they may wait in the lobby on the 4th floor. All care partners are required to wear a mask the entire time (we do not have any that we can provide them), they need to practice social distancing, and we will do a Covid check for all patient's and care partners when you arrive. Also we will check their temperature and your temperature. If the care partner waits in their car they need to stay in the parking lot the entire time and we will call them on their cell phone when the patient is ready for discharge so they can bring the car to the front of the building. Also all patient's will need to wear a mask into building.

## 2019-06-04 ENCOUNTER — Encounter: Payer: 59 | Admitting: Gastroenterology

## 2019-06-10 ENCOUNTER — Encounter: Payer: Self-pay | Admitting: Gastroenterology

## 2019-06-18 ENCOUNTER — Ambulatory Visit (AMBULATORY_SURGERY_CENTER): Payer: 59 | Admitting: Gastroenterology

## 2019-06-18 ENCOUNTER — Other Ambulatory Visit: Payer: Self-pay

## 2019-06-18 ENCOUNTER — Encounter: Payer: Self-pay | Admitting: Gastroenterology

## 2019-06-18 VITALS — BP 135/84 | HR 64 | Temp 98.0°F | Resp 15 | Ht 68.0 in | Wt 174.0 lb

## 2019-06-18 DIAGNOSIS — D125 Benign neoplasm of sigmoid colon: Secondary | ICD-10-CM

## 2019-06-18 DIAGNOSIS — Z1211 Encounter for screening for malignant neoplasm of colon: Secondary | ICD-10-CM | POA: Diagnosis not present

## 2019-06-18 DIAGNOSIS — D123 Benign neoplasm of transverse colon: Secondary | ICD-10-CM | POA: Diagnosis not present

## 2019-06-18 MED ORDER — SODIUM CHLORIDE 0.9 % IV SOLN: 500.0000 mL | Freq: Once | INTRAVENOUS | Status: DC

## 2019-06-18 NOTE — Patient Instructions (Signed)
Handouts provided on polyps and hemorrhoids.   YOU HAD AN ENDOSCOPIC PROCEDURE TODAY AT THE [facility]:   Refer to the procedure report that was given to you for any specific questions about what was found during the examination.  If the procedure report does not answer your questions, please call your gastroenterologist to clarify.  If you requested that your care partner not be given the details of your procedure findings, then the procedure report has been included in a sealed envelope for you to review at your convenience later.  YOU SHOULD EXPECT: Some feelings of bloating in the abdomen. Passage of more gas than usual.  Walking can help get rid of the air that was put into your GI tract during the procedure and reduce the bloating. If you had a lower endoscopy (such as a colonoscopy or flexible sigmoidoscopy) you may notice spotting of blood in your stool or on the toilet paper. If you underwent a bowel prep for your procedure, you may not have a normal bowel movement for a few days.  Please Note:  You might notice some irritation and congestion in your nose or some drainage.  This is from the oxygen used during your procedure.  There is no need for concern and it should clear up in a day or so.  SYMPTOMS TO REPORT IMMEDIATELY:  Following lower endoscopy (colonoscopy or flexible sigmoidoscopy):  Excessive amounts of blood in the stool  Significant tenderness or worsening of abdominal pains  Swelling of the abdomen that is new, acute  Fever of 100F or higher  For urgent or emergent issues, a gastroenterologist can be reached at any hour by calling [phone]. Do not use MyChart messaging for urgent concerns.    DIET:  We do recommend a small meal at first, but then you may proceed to your regular diet.  Drink plenty of fluids but you should avoid alcoholic beverages for 24 hours.  ACTIVITY:  You should plan to take it easy for the rest of today and you should NOT DRIVE  or use heavy machinery until tomorrow (because of the sedation medicines used during the test).    FOLLOW UP: Our staff will call the number listed on your records 48-72 hours following your procedure to check on you and address any questions or concerns that you may have regarding the information given to you following your procedure. If we do not reach you, we will leave a message.  We will attempt to reach you two times.  During this call, we will ask if you have developed any symptoms of COVID 19. If you develop any symptoms (ie: fever, flu-like symptoms, shortness of breath, cough etc.) before then, please call [phone].  If you test positive for Covid 19 in the 2 weeks post procedure, please call and report this information to us.    If any biopsies were taken you will be contacted by phone or by letter within the next 1-3 weeks.  Please call us at [phone] if you have not heard about the biopsies in 3 weeks.    SIGNATURES/CONFIDENTIALITY: You and/or your care partner have signed paperwork which will be entered into your electronic medical record.  These signatures attest to the fact that that the information above on your After Visit Summary has been reviewed and is understood.  Full responsibility of the confidentiality of this discharge information lies with you and/or your care-partner.  

## 2019-06-18 NOTE — Progress Notes (Signed)
Called to room to assist during endoscopic procedure.  Patient ID and intended procedure confirmed with present staff. Received instructions for my participation in the procedure from the performing physician.  

## 2019-06-18 NOTE — Progress Notes (Signed)
Temperature and VS taken by C.W.

## 2019-06-18 NOTE — Progress Notes (Signed)
Pt's states no medical or surgical changes since previsit or office visit. 

## 2019-06-18 NOTE — Op Note (Signed)
[name] Procedure Date: 06/18/2019 11:05 AM MRN: [id number] Endoscopist: Milus Banister , MD Age: 49 Referring MD:  Date of Birth: March 14, 1970 Gender: Male Account #: [id number] Procedure:                Colonoscopy Indications:              Screening for colorectal malignant neoplasm Medicines:                Monitored Anesthesia Care Procedure:                Pre-Anesthesia Assessment:                           - Prior to the procedure, a History and Physical                            was performed, and patient medications and                            allergies were reviewed. The patient's tolerance of                            previous anesthesia was also reviewed. The risks                            and benefits of the procedure and the sedation                            options and risks were discussed with the patient.                            All questions were answered, and informed consent                            was obtained. Prior Anticoagulants: The patient has                            taken no previous anticoagulant or antiplatelet                            agents. ASA Grade Assessment: II - A patient with                            mild systemic disease. After reviewing the risks                            and benefits, the patient was deemed in                            satisfactory condition to undergo the procedure.                           After obtaining informed consent, the colonoscope  was passed under direct vision. Throughout the                            procedure, the patient's blood pressure, pulse, and                            oxygen saturations were monitored continuously. The                            Colonoscope was introduced through the anus and                            advanced to the the cecum, identified by                            appendiceal orifice and  ileocecal valve. The                            colonoscopy was performed without difficulty. The                            patient tolerated the procedure well. The quality                            of the bowel preparation was good. The ileocecal                            valve, appendiceal orifice, and rectum were                            photographed. Scope In: 11:13:02 AM Scope Out: 11:26:17 AM Scope Withdrawal Time: 0 hours 10 minutes 24 seconds  Total Procedure Duration: 0 hours 13 minutes 15 seconds  Findings:                 Two sessile polyps were found in the sigmoid colon                            and transverse colon. The polyps were 2 to 7 mm in                            size. These polyps were removed with a cold snare.                            Resection and retrieval were complete.                           External and internal hemorrhoids were found. The                            hemorrhoids were small.                           The exam was otherwise without abnormality on  direct and retroflexion views. Complications:            No immediate complications. Estimated blood loss:                            None. Estimated Blood Loss:     Estimated blood loss: none. Impression:               - Two 2 to 7 mm polyps in the sigmoid colon and in                            the transverse colon, removed with a cold snare.                            Resected and retrieved.                           - External and internal hemorrhoids.                           - The examination was otherwise normal on direct                            and retroflexion views. Recommendation:           - Patient has a contact number available for                            emergencies. The signs and symptoms of potential                            delayed complications were discussed with the                            patient. Return to normal activities  tomorrow.                            Written discharge instructions were provided to the                            patient.                           - Resume previous diet.                           - Continue present medications.                           - Await pathology results. Milus Banister, MD 06/18/2019 11:28:48 AM This report has been signed electronically.

## 2019-06-22 ENCOUNTER — Telehealth: Payer: Self-pay

## 2019-06-22 NOTE — Telephone Encounter (Signed)
  Follow up Call-  Call back number 06/18/2019  Post procedure Call Back phone  # [phone]  Permission to leave phone message Yes  Some recent data might be hidden     Patient questions:  Do you have a fever, pain , or abdominal swelling? No. Pain Score  0 *  Have you tolerated food without any problems? Yes.    Have you been able to return to your normal activities? Yes.    Do you have any questions about your discharge instructions: Diet   No. Medications  No. Follow up visit  No.  Do you have questions or concerns about your Care? No.  Actions: * If pain score is 4 or above: No action needed, pain <4.  Have you developed a fever since your procedure? No 2.   Have you had an respiratory symptoms (SOB or cough) since your procedure? No  3.   Have you tested positive for COVID 19 since your procedure No  4.   Have you had any family members/close contacts diagnosed with the COVID 19 since your procedure?  No   If yes to any of these questions please route to Joylene John, RN and Erenest Rasher, RN

## 2019-06-25 ENCOUNTER — Encounter: Payer: Self-pay | Admitting: Gastroenterology

## 2020-04-03 IMAGING — US US THYROID
1 series · 14 of 25 positions shown · non-contrast
Comparison: MR 01/05/2007, ultrasound 03/08/2015, 09/19/2009

CLINICAL DATA: 47-year-old male with known thyroglossal duct cyst

EXAM:
THYROID ULTRASOUND
TECHNIQUE: Ultrasound examination of the thyroid gland and adjacent soft
tissues was performed.

[Series 1: us thyroid · 0.05mm/px · 14 of 44 slices shown]
[im 1/44]
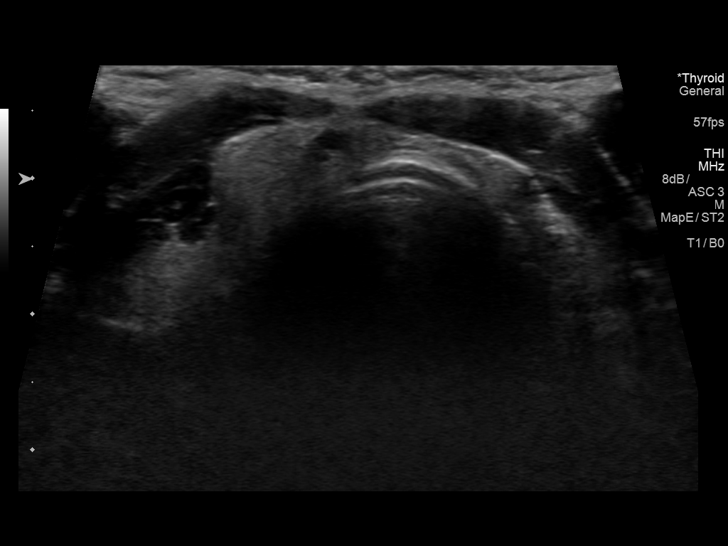
[im 4/44]
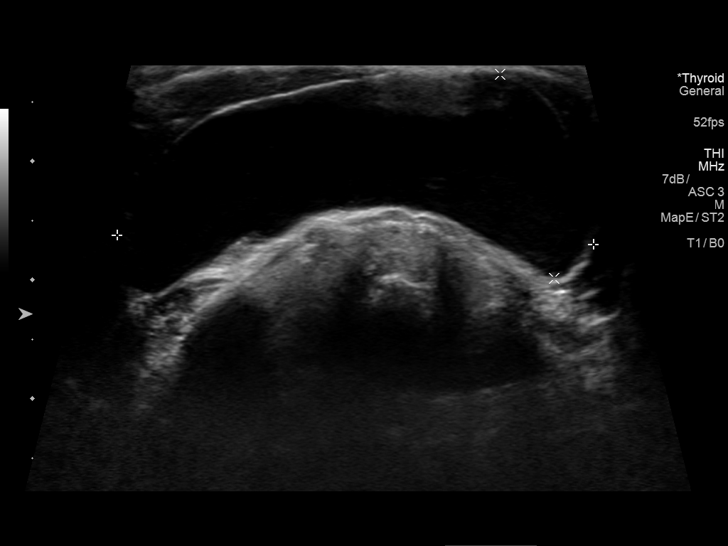
[im 8/44]
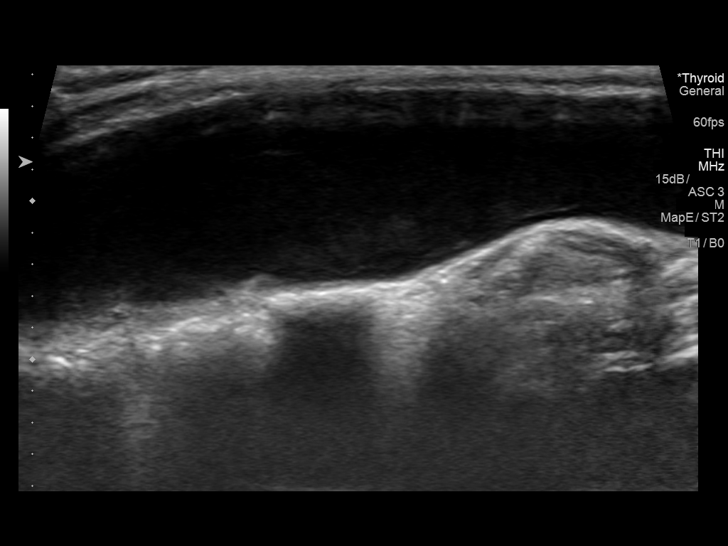
[im 11/44]
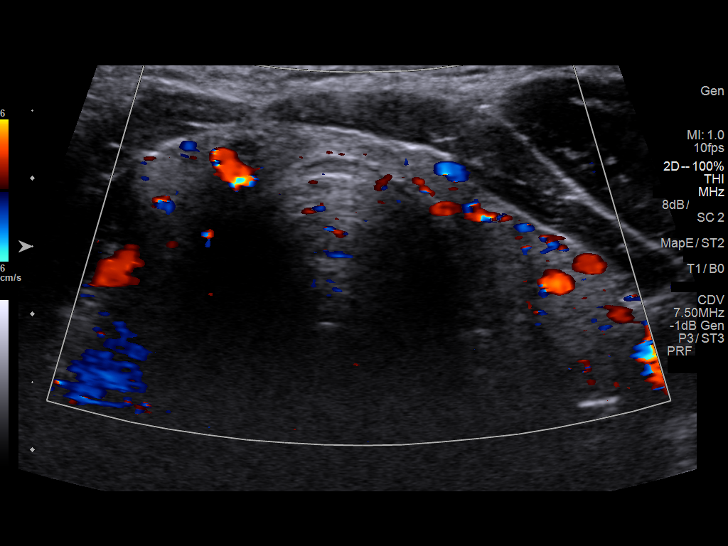
[im 15/44]
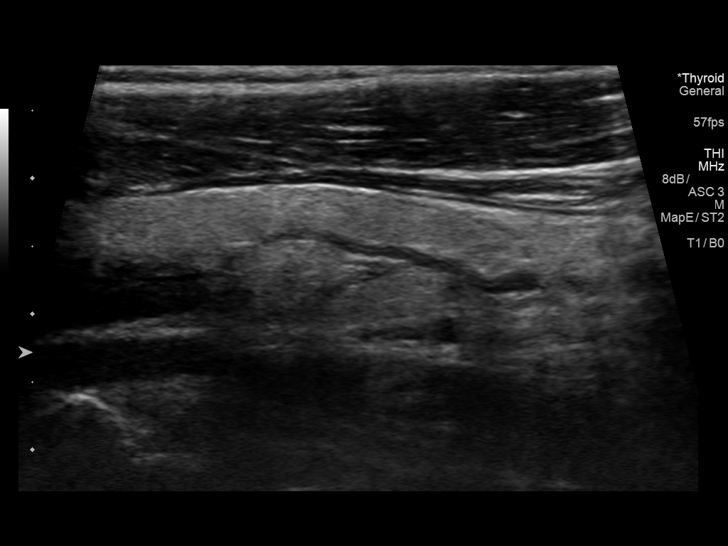
[im 17/44]
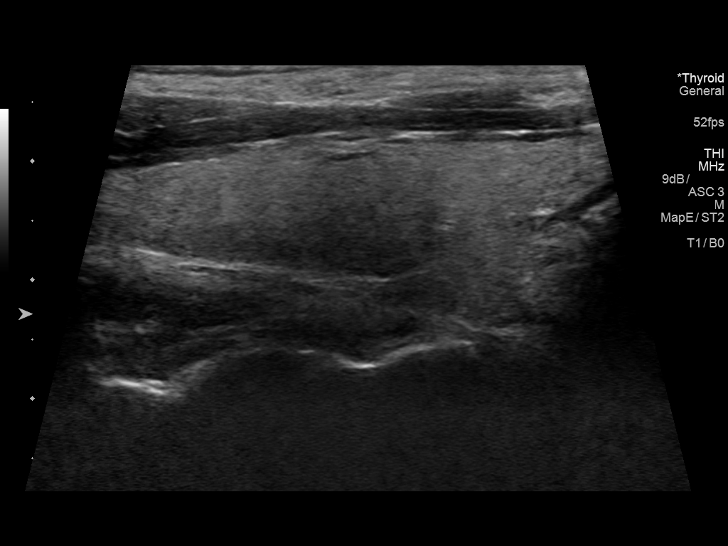
[im 20/44]
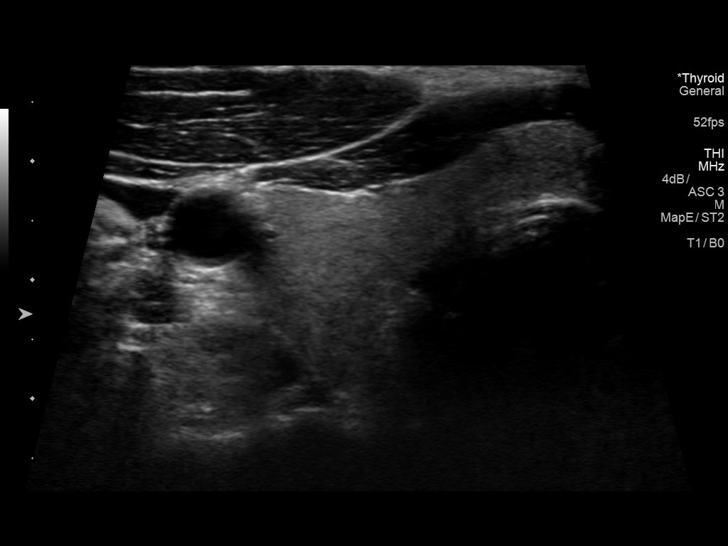
[im 24/44]
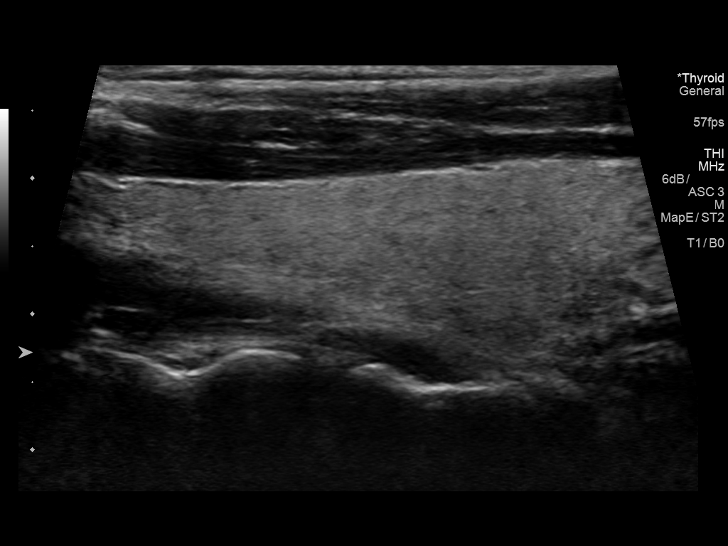
[im 27/44]
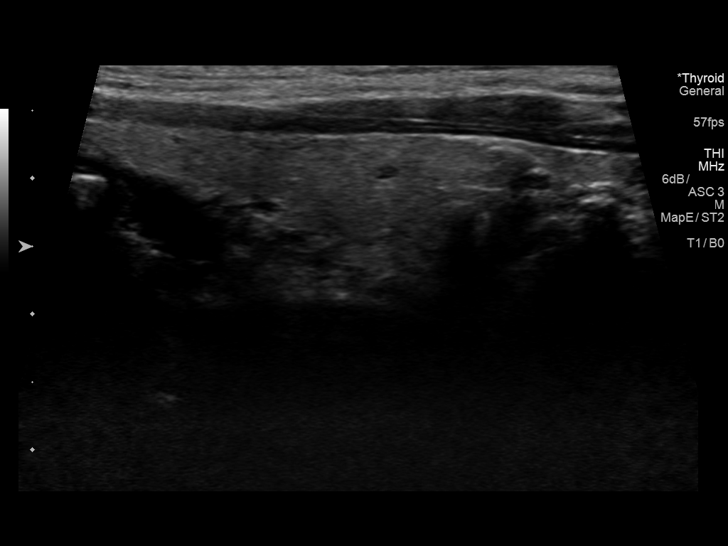
[im 29/44]
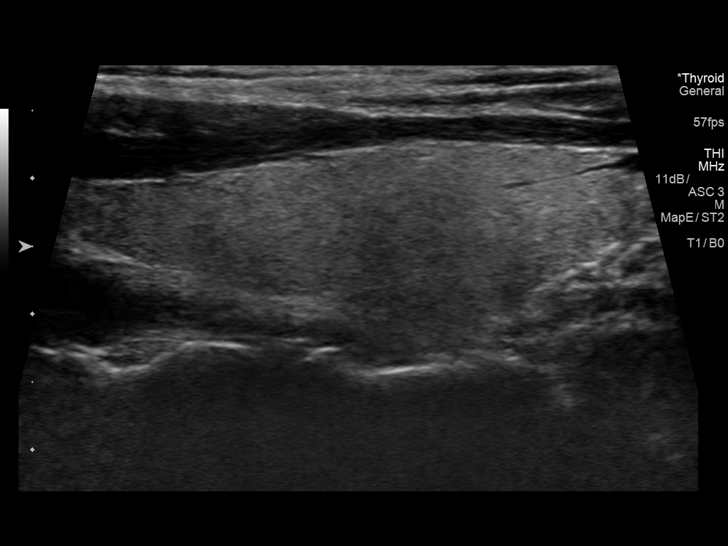
[im 33/44]
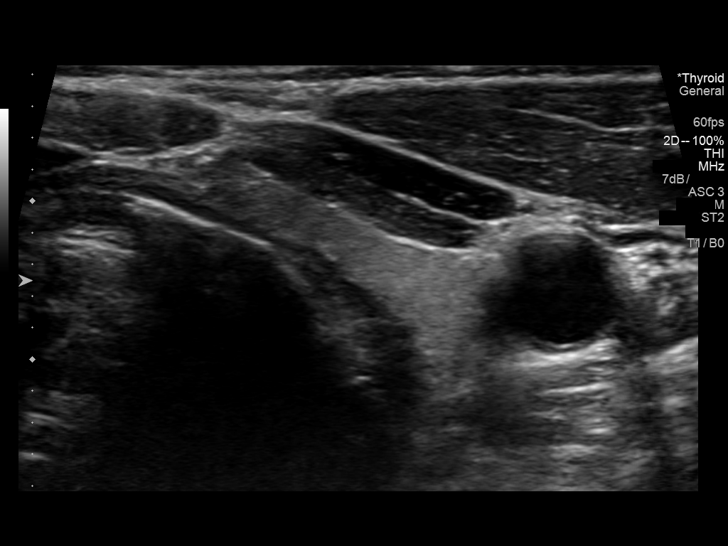
[im 36/44]
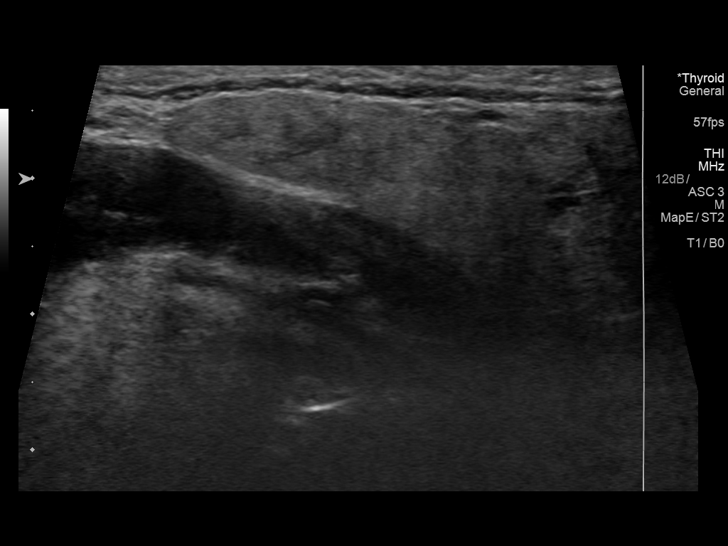
[im 40/44]
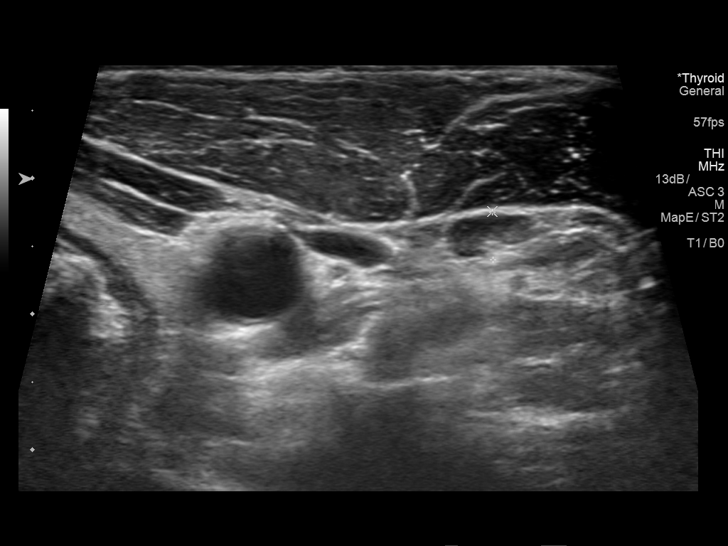
[im 44/44]
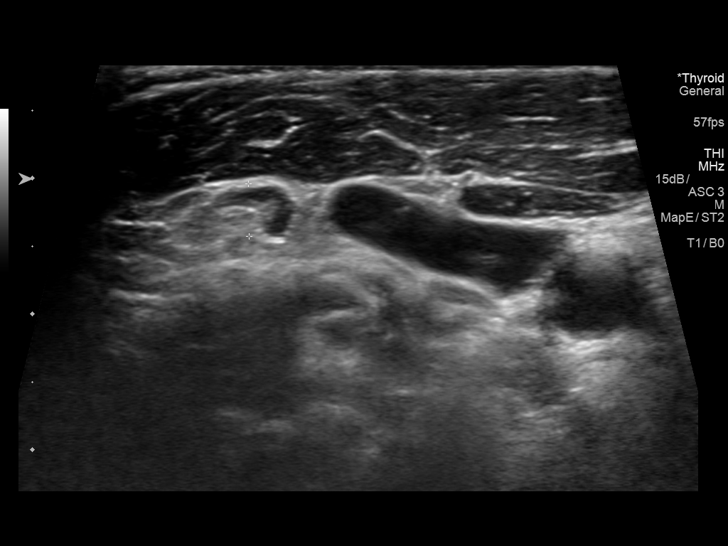

[14 of 25 positions shown; findings below may reference images not displayed]

FINDINGS: Parenchymal Echotexture: Normal

Isthmus: 0.3 cm

Right lobe: 4.5 cm x 1.2 cm x 1.7 cm

Left lobe: 4.5 cm x 1.4 cm x 1.5 cm

_________________________________________________________

Estimated total number of nodules >/= 1 cm: 0

Number of spongiform nodules >/=  2 cm not described below (TR1): 0

Number of mixed cystic and solid nodules >/= 1.5 cm not described
below (TR2): 0

_________________________________________________________

No discrete nodules are seen within the thyroid gland.

No adenopathy.

Anechoic cystic structure in the midline neck, greatest diameter
cm x 1.8 cm x 6.6 cm, similar in size to comparison studies.
IMPRESSION: Unremarkable sonographic survey of the thyroid.

Redemonstration of thyroglossal duct cyst.

## 2020-04-24 ENCOUNTER — Encounter: Payer: 59 | Admitting: Family Medicine

## 2020-04-30 NOTE — Progress Notes (Signed)
HPI:  Cristian Heath is a 50 y.o.male here today for his routine physical examination.  Last CPE: 03/15/19. He lives with his wife and 2 children.  Regular exercise 3 or more times per week: Walking for 60 min daily. Following a healthy diet: Yes, mainly home cooked meals. Sleeping about 8 hours.  Chronic medical problems: HLD,GERD, BPH, and thyroglosal duct cyst among some.  Immunization History  Administered Date(s) Administered  . Influenza,inj,Quad PF,6+ Mos 10/09/2013  . Influenza-Unspecified 10/07/2011, 10/18/2015  . Tdap 03/13/2018   Hep C screening: Never. Last colon cancer screening: Colonoscopy on 06/18/19. Last prostate ca screening: On 03/15/19 PSA was 0.8. Nocturia x 1-2.  Negative for high alcohol intake or tobacco use.  -Concerns and/or follow up today:  Hyperlipidemia: Currently he is on nonpharmacologic treatment. Lab Results  Component Value Date   CHOL 191 03/15/2019   HDL 47.40 03/15/2019   LDLCALC 132 (H) 03/15/2019   TRIG 56.0 03/15/2019   CHOLHDL 4 03/15/2019   Episodes of right fronto-temporal headache, every 6 weeks, dull pain, 4/10. No associated photophobia, visual changes, N/V,or focal neurologic deficit. Problem is not new and seems to be stable.  He takes OTC Excedrin migraine and it usually helps.  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  Since COVID-19 pandemia he has not been tolerating that much, so he has not been taking medication. He is expecting to start traveling again in the next few months.  Thyroglossal duct cyst: He has not noted growth, dysphagia,or anterior neck masses. Lab Results  Component Value Date   TSH 1.09 03/15/2019   Thyroid US 03/2018:Unremarkable sonographic survey of the thyroid. Redemonstration of thyroglossal duct cyst.  Review of Systems  Constitutional: Negative for activity change, appetite change, fatigue and fever.  HENT: Negative for  mouth sores, nosebleeds, sore throat and trouble swallowing.   Eyes: Negative for redness and visual disturbance.  Respiratory: Negative for cough, shortness of breath and wheezing.   Cardiovascular: Negative for chest pain, palpitations and leg swelling.  Gastrointestinal: Negative for abdominal pain, nausea and vomiting.  Endocrine: Negative for cold intolerance, heat intolerance, polydipsia, polyphagia and polyuria.  Genitourinary: Negative for decreased urine volume, dysuria and hematuria.  Musculoskeletal: Negative for gait problem and myalgias.  Skin: Negative for pallor and rash.  Allergic/Immunologic: Positive for environmental allergies.  Neurological: Positive for headaches. Negative for syncope, facial asymmetry and weakness.  Hematological: Negative for adenopathy. Does not bruise/bleed easily.  Psychiatric/Behavioral: Negative for behavioral problems and confusion.  All other systems reviewed and are negative.  Current Outpatient Medications on File Prior to Visit  Medication Sig Dispense Refill  . Ascorbic Acid (VITAMIN C) 1000 MG tablet Take 1,000 mg by mouth daily.    . cetirizine (ZYRTEC) 10 MG tablet Take 10 mg by mouth daily.    . Omega-3 Fatty Acids (FISH OIL) 1000 MG CAPS Take by mouth daily.    . Vitamin D, Cholecalciferol, 50 MCG (2000 UT) CAPS Take by mouth.    . zinc gluconate 50 MG tablet Take 50 mg by mouth daily.    Marland Kitchen zolpidem (AMBIEN) 5 MG tablet Take 1 tablet (5 mg total) by mouth at bedtime as needed for sleep. 30 tablet 1   No current facility-administered medications on file prior to visit.   Past Medical History:  Diagnosis Date  . Allergic rhinitis   . Allergy   . Chicken pox   . Hypercholesteremia   .  Thyroglossal duct cyst    Followed with ENT last in 2015    Past Surgical History:  Procedure Laterality Date  . EYE SURGERY     Corneal Lasik,bilateral;01/2001  . INGUINAL HERNIA REPAIR Bilateral 1992  . MYRINGOPLASTY     at age 23.  .  REFRACTIVE SURGERY  2005  . WISDOM TOOTH EXTRACTION     No Known Allergies  Family History  Problem Relation Age of Onset  . Heart disease Maternal Grandmother   . Hypertension Maternal Grandmother   . Stroke Maternal Grandfather   . Cancer Paternal Grandmother        ? Brain  . Heart disease Paternal Grandfather        MI  . Colon cancer Neg Hx   . Colon polyps Neg Hx   . Esophageal cancer Neg Hx   . Stomach cancer Neg Hx   . Rectal cancer Neg Hx     Social History   Socioeconomic History  . Marital status: Married    Spouse name: Not on file  . Number of children: Not on file  . Years of education: Not on file  . Highest education level: Not on file  Occupational History  . Not on file  Tobacco Use  . Smoking status: Never Smoker  . Smokeless tobacco: Never Used  Substance and Sexual Activity  . Alcohol use: No  . Drug use: No  . Sexual activity: Yes  Other Topics Concern  . Not on file  Social History Narrative   The patient is married with 2 children, ages 93 and 80. He is a lifetime nonsmoker. He drinks alcohol only on rare occasions. He works as a Network engineer at [employer].   Social Determinants of Health   Financial Resource Strain: Not on file  Food Insecurity: Not on file  Transportation Needs: Not on file  Physical Activity: Not on file  Stress: Not on file  Social Connections: Not on file   Vitals:   05/01/20 0831  BP: 118/82  Pulse: 84  Resp: 12  Temp: 97.6 F (36.4 C)  SpO2: 98%   Body mass index is 25.61 kg/m.  Wt Readings from Last 3 Encounters:  05/01/20 168 lb 6.4 oz (76.4 kg)  06/18/19 174 lb (78.9 kg)  06/01/19 174 lb (78.9 kg)   Physical Exam Vitals and nursing note reviewed.  Constitutional:      General: He is not in acute distress.    Appearance: He is well-developed.  HENT:     Head: Normocephalic and atraumatic.     Right Ear: Tympanic membrane, ear canal and external ear normal.     Left Ear: Tympanic membrane, ear  canal and external ear normal.     Mouth/Throat:     Mouth: Mucous membranes are moist.     Pharynx: Oropharynx is clear.  Eyes:     Extraocular Movements: Extraocular movements intact.     Conjunctiva/sclera: Conjunctivae normal.     Pupils: Pupils are equal, round, and reactive to light.  Neck:     Thyroid: No thyroid mass.     Trachea: No tracheal deviation.  Cardiovascular:     Rate and Rhythm: Normal rate and regular rhythm.     Pulses:          Dorsalis pedis pulses are 2+ on the right side and 2+ on the left side.     Heart sounds: No murmur heard.   Pulmonary:     Effort: Pulmonary effort is  normal. No respiratory distress.     Breath sounds: Normal breath sounds.  Chest:  Breasts:     Right: No supraclavicular adenopathy.     Left: No supraclavicular adenopathy.    Abdominal:     Palpations: Abdomen is soft. There is no hepatomegaly or mass.     Tenderness: There is no abdominal tenderness.  Genitourinary:    Comments: No concerns. Musculoskeletal:        General: No tenderness.     Cervical back: Normal range of motion.     Comments: No major deformities appreciated and no signs of synovitis.  Lymphadenopathy:     Cervical: No cervical adenopathy.     Upper Body:     Right upper body: No supraclavicular adenopathy.     Left upper body: No supraclavicular adenopathy.  Skin:    General: Skin is warm.     Findings: No erythema.  Neurological:     General: No focal deficit present.     Mental Status: He is alert and oriented to person, place, and time.     Cranial Nerves: No cranial nerve deficit.     Sensory: No sensory deficit.     Coordination: Coordination normal.     Gait: Gait normal.     Deep Tendon Reflexes:     Reflex Scores:      Bicep reflexes are 2+ on the right side and 2+ on the left side.      Patellar reflexes are 2+ on the right side and 2+ on the left side.  ASSESSMENT AND PLAN:  CristianCzar was seen today for annual exam.  Diagnoses  and all orders for this visit: Orders Placed This Encounter  Procedures  . Hepatitis C antibody screen  . Lipid panel  . Hemoglobin A1c  . PSA(Must document that pt has been informed of limitations of PSA testing.)  . TSH  . Comprehensive metabolic panel   Lab Results  Component Value Date   ALT 7 05/01/2020   AST 11 05/01/2020   ALKPHOS 51 05/01/2020   BILITOT 1.1 05/01/2020   Lab Results  Component Value Date   CREATININE 1.05 05/01/2020   BUN 11 05/01/2020   NA 139 05/01/2020   K 4.3 05/01/2020   CL 102 05/01/2020   CO2 27 05/01/2020   Lab Results  Component Value Date   CHOL 188 05/01/2020   HDL 48.90 05/01/2020   LDLCALC 125 (H) 05/01/2020   TRIG 69.0 05/01/2020   CHOLHDL 4 05/01/2020   Lab Results  Component Value Date   PSA 0.89 05/01/2020   Lab Results  Component Value Date   HGBA1C 5.2 05/01/2020   Lab Results  Component Value Date   TSH 1.68 05/01/2020   Routine general medical examination at a health care facility He understands the importance of regular physical activity and healthy diet for prevention of chronic illness and/or complications. Preventive guidelines reviewed. Vaccination up to date.  Next CPE in a year.  Encounter for HCV screening test for low risk patient -     Hepatitis C antibody screen  Benign prostatic hyperplasia with nocturia -     PSA(Must document that pt has been informed of limitations of PSA testing.)  Screening for endocrine, metabolic and immunity disorder -     Comprehensive metabolic panel -     Hemoglobin A1c  Thyroglossal duct cyst Problem has been stable. I do not think thyroid imaging is needed at this time. Further recommendations according to TSH.  Hyperlipidemia, unspecified Continue nonpharmacologic treatment. Further recommendations according to 10-year CVD risk and lipid panel results.  Headache, unspecified headache type History and examination do not suggest a serious process. I do not  think head imaging is needed at this time. Continue OTC Excedrin Migraine. Monitor for new symptoms. Instructed about warning signs.  Circadian rhythm sleep disorder He will let me know if he needs refills for Ambien 5 mg when he resumes travel [place].  Benign prostatic hyperplasia with nocturia Symptoms are stable. Further recommendation according to PSA result.    Return in 1 year (on 05/01/2021) for CPE.   Collier Bohnet G. Martinique, MD  [facility]. [place] office.  A few things to remember from today's visit:   Circadian rhythm sleep disorder  Encounter for HCV screening test for low risk patient  Thyroglossal duct cyst  Hyperlipidemia, unspecified hyperlipidemia type  Benign prostatic hyperplasia with nocturia  If you need refills please call your pharmacy. Do not use My Chart to request refills or for acute issues that need immediate attention.    Please be sure medication list is accurate. If a new problem present, please set up appointment sooner than planned today.   At least 150 minutes of moderate exercise per week, daily brisk walking for 15-30 min is a good exercise option. Healthy diet low in saturated (animal) fats and sweets and consisting of fresh fruits and vegetables, lean meats such as fish and white chicken and whole grains.  - Vaccines:  Tdap vaccine every 10 years.  Shingles vaccine recommended at age 19, could be given after 52 years of age but not sure about insurance coverage.  Pneumonia vaccines: Pneumovax at 68   -Screening recommendations for low/normal risk males:  Screening for diabetes at age 20 and every 3 years. Earlier screening if cardiovascular risk factors.   Lipid screening at 35 and every 3 years. Screening starts in younger males with cardiovascular risk factors.N/A  Colon cancer screening is now at age 42 but your insurance may not cover until age 60 .screening is recommended age 33.  Prostate cancer  screening: some controversy, starts usually at 41: Rectal exam and PSA.  Aortic Abdominal Aneurism once between 54 and 73 years old if ever smoker.  Also recommended:  1. Dental visit- Brush and floss your teeth twice daily; visit your dentist twice a year. 2. Eye doctor- Get an eye exam at least every 2 years. 3. Helmet use- Always wear a helmet when riding a bicycle, motorcycle, rollerblading or skateboarding. 4. Safe sex- If you may be exposed to sexually transmitted infections, use a condom. 5. Seat belts- Seat belts can save your live; always wear one. 6. Smoke/Carbon Monoxide detectors- These detectors need to be installed on the appropriate level of your home. Replace batteries at least once a year. 7. Skin cancer- When out in the sun please cover up and use sunscreen 15 SPF or higher. 8. Violence- If anyone is threatening or hurting you, please tell your healthcare provider.  9. Drink alcohol in moderation- Limit alcohol intake to one drink or less per day. Never drink and drive.

## 2020-05-01 ENCOUNTER — Other Ambulatory Visit: Payer: Self-pay

## 2020-05-01 ENCOUNTER — Encounter: Payer: Self-pay | Admitting: Family Medicine

## 2020-05-01 ENCOUNTER — Ambulatory Visit (INDEPENDENT_AMBULATORY_CARE_PROVIDER_SITE_OTHER): Payer: 59 | Admitting: Family Medicine

## 2020-05-01 VITALS — BP 118/82 | HR 84 | Temp 97.6°F | Resp 12 | Ht 68.0 in | Wt 168.4 lb

## 2020-05-01 DIAGNOSIS — Z0000 Encounter for general adult medical examination without abnormal findings: Secondary | ICD-10-CM

## 2020-05-01 DIAGNOSIS — Q892 Congenital malformations of other endocrine glands: Secondary | ICD-10-CM

## 2020-05-01 DIAGNOSIS — Z1329 Encounter for screening for other suspected endocrine disorder: Secondary | ICD-10-CM | POA: Diagnosis not present

## 2020-05-01 DIAGNOSIS — R351 Nocturia: Secondary | ICD-10-CM | POA: Diagnosis not present

## 2020-05-01 DIAGNOSIS — E785 Hyperlipidemia, unspecified: Secondary | ICD-10-CM

## 2020-05-01 DIAGNOSIS — G4720 Circadian rhythm sleep disorder, unspecified type: Secondary | ICD-10-CM | POA: Diagnosis not present

## 2020-05-01 DIAGNOSIS — Z130 Encounter for screening for diseases of the blood and blood-forming organs and certain disorders involving the immune mechanism: Secondary | ICD-10-CM | POA: Diagnosis not present

## 2020-05-01 DIAGNOSIS — N401 Enlarged prostate with lower urinary tract symptoms: Secondary | ICD-10-CM

## 2020-05-01 DIAGNOSIS — Z1159 Encounter for screening for other viral diseases: Secondary | ICD-10-CM | POA: Diagnosis not present

## 2020-05-01 DIAGNOSIS — Z13228 Encounter for screening for other metabolic disorders: Secondary | ICD-10-CM

## 2020-05-01 DIAGNOSIS — R519 Headache, unspecified: Secondary | ICD-10-CM | POA: Insufficient documentation

## 2020-05-01 LAB — COMPREHENSIVE METABOLIC PANEL
ALT: 7 U/L (ref 0–53)
AST: 11 U/L (ref 0–37)
Albumin: 4.3 g/dL (ref 3.5–5.2)
Alkaline Phosphatase: 51 U/L (ref 39–117)
BUN: 11 mg/dL (ref 6–23)
CO2: 27 mEq/L (ref 19–32)
Calcium: 9.6 mg/dL (ref 8.4–10.5)
Chloride: 102 mEq/L (ref 96–112)
Creatinine, Ser: 1.05 mg/dL (ref 0.40–1.50)
GFR: 82.07 mL/min (ref >60.00)
Glucose, Bld: 94 mg/dL (ref 70–99)
Potassium: 4.3 mEq/L (ref 3.5–5.1)
Sodium: 139 mEq/L (ref 135–145)
Total Bilirubin: 1.1 mg/dL (ref 0.2–1.2)
Total Protein: 6.8 g/dL (ref 6.0–8.3)

## 2020-05-01 LAB — LIPID PANEL
Cholesterol: 188 mg/dL (ref 0–200)
HDL: 48.9 mg/dL (ref >39.00)
LDL Cholesterol: 125 mg/dL — ABNORMAL HIGH (ref 0–99)
NonHDL: 138.87
Total CHOL/HDL Ratio: 4
Triglycerides: 69 mg/dL (ref 0.0–149.0)
VLDL: 13.8 mg/dL (ref 0.0–40.0)

## 2020-05-01 LAB — TSH: TSH: 1.68 u[IU]/mL (ref 0.35–4.50)

## 2020-05-01 LAB — PSA: PSA: 0.89 ng/mL (ref 0.10–4.00)

## 2020-05-01 LAB — HEMOGLOBIN A1C: Hgb A1c MFr Bld: 5.2 % (ref 4.6–6.5)

## 2020-05-01 NOTE — Assessment & Plan Note (Signed)
History and examination do not suggest a serious process. I do not think head imaging is needed at this time. Continue OTC Excedrin Migraine. Monitor for new symptoms. Instructed about warning signs.

## 2020-05-01 NOTE — Assessment & Plan Note (Signed)
Continue nonpharmacologic treatment. Further recommendations according to 10-year CVD risk and lipid panel results.

## 2020-05-01 NOTE — Assessment & Plan Note (Signed)
Problem has been stable. I do not think thyroid imaging is needed at this time. Further recommendations according to TSH.

## 2020-05-01 NOTE — Assessment & Plan Note (Signed)
Symptoms are stable. Further recommendation according to PSA result.

## 2020-05-01 NOTE — Assessment & Plan Note (Signed)
He will let me know if he needs refills for Ambien 5 mg when he [place]umes travel [place].

## 2020-05-01 NOTE — Patient Instructions (Addendum)
A few things to remember from today's visit:  Routine general medical examination at a health care facility  Circadian rhythm sleep disorder  Encounter for HCV screening test for low risk patient - Plan: Hepatitis C antibody screen  Thyroglossal duct cyst - Plan: TSH  Hyperlipidemia, unspecified hyperlipidemia type - Plan: Lipid panel  Benign prostatic hyperplasia with nocturia - Plan: PSA(Must document that pt has been informed of limitations of PSA testing.)  Screening for endocrine, metabolic and immunity disorder - Plan: Hemoglobin A1c, Comprehensive metabolic panel  Headache, unspecified headache type  Headache does not seem concerning at this time. Monitor for changes and follow if new symptoms present.  If you need refills please call your pharmacy. Do not use My Chart to request refills or for acute issues that need immediate attention.    Please be sure medication list is accurate. If a new problem present, please set up appointment sooner than planned today.  At least 150 minutes of moderate exercise per week, daily brisk walking for 15-30 min is a good exercise option. Healthy diet low in saturated (animal) fats and sweets and consisting of fresh fruits and vegetables, lean meats such as fish and white chicken and whole grains.  - Vaccines:  Tdap vaccine every 10 years.  Shingles vaccine recommended at age 63, could be given after 52 years of age but not sure about insurance coverage.  Pneumonia vaccines: Pneumovax at 108  -Screening recommendations for low/normal risk males:  Screening for diabetes at age 55 and every 3 years. Earlier screening if cardiovascular risk factors.   Lipid screening at 35 and every 3 years. Screening starts in younger males with cardiovascular risk factors.N/A  Colon cancer screening is now at age 71 but your insurance may not cover until age 50 .screening is recommended age 26.  Prostate cancer screening: some controversy, starts  usually at 25: Rectal exam and PSA.  Aortic Abdominal Aneurism once between 93 and 43 years old if ever smoker.  Also recommended:  1. Dental visit- Brush and floss your teeth twice daily; visit your dentist twice a year. 2. Eye doctor- Get an eye exam at least every 2 years. 3. Helmet use- Always wear a helmet when riding a bicycle, motorcycle, rollerblading or skateboarding. 4. Safe sex- If you may be exposed to sexually transmitted infections, use a condom. 5. Seat belts- Seat belts can save your live; always wear one. 6. Smoke/Carbon Monoxide detectors- These detectors need to be installed on the appropriate level of your home. Replace batteries at least once a year. 7. Skin cancer- When out in the sun please cover up and use sunscreen 15 SPF or higher. 8. Violence- If anyone is threatening or hurting you, please tell your healthcare provider.  9. Drink alcohol in moderation- Limit alcohol intake to one drink or less per day. Never drink and drive.

## 2020-05-02 LAB — HEPATITIS C ANTIBODY
Hepatitis C Ab: NONREACTIVE
SIGNAL TO CUT-OFF: 0.01 (ref <1.00)

## 2020-05-02 NOTE — Telephone Encounter (Signed)
See lab result encounter.

## 2020-07-09 ENCOUNTER — Telehealth: Payer: 59

## 2020-07-09 ENCOUNTER — Telehealth: Payer: 59 | Admitting: Family Medicine

## 2020-07-09 DIAGNOSIS — J029 Acute pharyngitis, unspecified: Secondary | ICD-10-CM

## 2020-07-09 NOTE — Progress Notes (Signed)
Needs daughter seen Provided instructions to have her log in via [place].  Cancel this visit. No charge.

## 2022-02-15 NOTE — Progress Notes (Unsigned)
Chief Complaint  Patient presents with   Annual Exam   HPI: [name] is a 52 y.o.male with PMHx significant for HLD and BPH here today for his routine physical examination.  Last CPE: 05/01/2020 He reports no new problems since his last visit in April of the previous year.  He states that he exercises regularly, walking approximately two miles twice daily.  He maintains a generally healthy diet, consisting of chicken, fish, and red meat.  He sleeps an average of eight hours per night, does not smoke, and abstains from alcohol consumption.  Immunization History  Administered Date(s) Administered   Influenza,inj,Quad PF,6+ Mos 10/09/2013   Influenza-Unspecified 10/07/2011, 10/18/2015   Tdap 03/13/2018   Health Maintenance  Topic Date Due   Zoster Vaccines- Shingrix (1 of 2) Never done   COVID-19 Vaccine (1) 03/03/2022 (Originally 02/10/1971)   INFLUENZA VACCINE  04/07/2022 (Originally 08/07/2021)   HIV Screening  03/15/2023 (Originally 08/11/1985)   COLONOSCOPY (Pts 45-58yr Insurance coverage will need to be confirmed)  06/18/2026   DTaP/Tdap/Td (2 - Td or Tdap) 03/12/2028   Hepatitis C Screening  Completed   HPV VACCINES  Aged Out  Completed shingles vaccination last year.  Last prostate ca screening: Nocturia is a stable, 1-2 times per night. Negative for gross hematuria or difficulty starting urination. Lab Results  Component Value Date   PSA 0.89 05/01/2020   PSA 0.80 03/15/2019   PSA 0.95 03/13/2018   Hyperlipidemia: Currently he is on nonpharmacologic treatment.  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 Last soft tissue/neck [place]oreadone in 03/2015: The thyroglossal duct cyst just above the is but is a slightly smaller, measuring up to 4.8 cm and previously measuring up to 5.9 cm.  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  Review of Systems  Constitutional:  Negative for activity change, appetite change and fever.  HENT:  Negative for nosebleeds, sore throat, trouble swallowing and voice change.   Eyes:  Negative for redness and visual disturbance.  Respiratory:  Negative for cough, shortness of breath and wheezing.   Cardiovascular:  Negative for chest pain, palpitations and leg swelling.  Gastrointestinal:  Negative for abdominal pain, blood in stool, nausea and vomiting.  Endocrine: Negative for cold intolerance, heat intolerance, polydipsia, polyphagia and polyuria.  Genitourinary:  Negative for decreased urine volume, dysuria, genital sores and testicular pain.  Musculoskeletal:  Negative for gait problem and myalgias.  Skin:  Negative for color change and rash.  Allergic/Immunologic: Positive for environmental allergies.  Neurological:  Negative for seizures, syncope, weakness and headaches.  Hematological:  Negative for adenopathy. Does not bruise/bleed easily.  Psychiatric/Behavioral:  Negative for confusion.   All other systems reviewed and are negative.  Current Outpatient Medications on File Prior to Visit  Medication Sig Dispense Refill   Ascorbic Acid (VITAMIN C) 1000 MG tablet Take 1,000 mg by mouth daily.     cetirizine (ZYRTEC) 10 MG tablet Take 10 mg by mouth daily.     Omega-3 Fatty Acids (FISH OIL) 1000 MG CAPS Take by mouth daily.  Vitamin D, Cholecalciferol, 50 MCG (2000 UT) CAPS Take by mouth.     zinc gluconate 50 MG tablet Take 50 mg by mouth daily.     zolpidem (AMBIEN) 5 MG tablet Take 1 tablet (5 mg total) by mouth at bedtime as needed for sleep. 30 tablet 1   No current facility-administered medications on file prior to visit.   Past Medical History:   Diagnosis Date   Allergic rhinitis    Allergy    Chicken pox    Hypercholesteremia    Thyroglossal duct cyst    Followed with ENT last in 2015   Past Surgical History:  Procedure Laterality Date   EYE SURGERY     Corneal Lasik,bilateral;01/2001   INGUINAL HERNIA REPAIR Bilateral 1992   MYRINGOPLASTY     at age 69.   REFRACTIVE SURGERY  2005   WISDOM TOOTH EXTRACTION     No Known Allergies  Family History  Problem Relation Age of Onset   Heart disease Maternal Grandmother    Hypertension Maternal Grandmother    Stroke Maternal Grandfather    Cancer Paternal Grandmother        ? Brain   Heart disease Paternal Grandfather        MI   Colon cancer Neg Hx    Colon polyps Neg Hx    Esophageal cancer Neg Hx    Stomach cancer Neg Hx    Rectal cancer Neg Hx    Social History   Socioeconomic History   Marital status: Married    Spouse name: Not on file   Number of children: Not on file   Years of education: Not on file   Highest education level: Not on file  Occupational History   Not on file  Tobacco Use   Smoking status: Never   Smokeless tobacco: Never  Substance and Sexual Activity   Alcohol use: No   Drug use: No   Sexual activity: Yes  Other Topics Concern   Not on file  Social History Narrative   The patient is married with 2 children, ages 40 and 75. He is a lifetime nonsmoker. He drinks alcohol only on rare occasions. He works as a Network engineer at [employer].   Social Determinants of Health   Financial Resource Strain: Not on file  Food Insecurity: Not on file  Transportation Needs: Not on file  Physical Activity: Not on file  Stress: Not on file  Social Connections: Not on file    Vitals:   02/18/22 0823  BP: 120/78  Pulse: 70  Resp: 12  Temp: 98.2 F (36.8 C)  SpO2: 98%  Body mass index is 26.78 kg/m. Wt Readings from Last 3 Encounters:  02/18/22 176 lb 2 oz (79.9 kg)  05/01/20 168 lb 6.4 oz (76.4 kg)  06/18/19 174 lb (78.9 kg)  Physical  Exam Vitals and nursing note reviewed.  Constitutional:      General: He is not in acute distress.    Appearance: He is well-developed.  HENT:     Head: Normocephalic and atraumatic.     Right Ear: Tympanic membrane, ear canal and external ear normal.     Left Ear: Tympanic membrane, ear canal and external ear normal.  Eyes:     Extraocular Movements: Extraocular movements intact.     Conjunctiva/sclera: Conjunctivae normal.     Pupils: Pupils are equal, round, and reactive to light.  Neck:     Thyroid: No thyromegaly.     Trachea: No tracheal  deviation.  Cardiovascular:     Rate and Rhythm: Normal rate and regular rhythm.     Pulses:          Dorsalis pedis pulses are 2+ on the right side and 2+ on the left side.     Heart sounds: No murmur heard. Pulmonary:     Effort: Pulmonary effort is normal. No respiratory distress.     Breath sounds: Normal breath sounds.  Abdominal:     Palpations: Abdomen is soft. There is no hepatomegaly or mass.     Tenderness: There is no abdominal tenderness.  Genitourinary:    Comments: No concerns. Musculoskeletal:        General: No tenderness.     Cervical back: Normal range of motion.     Comments: No major deformities appreciated and no signs of synovitis.  Lymphadenopathy:     Cervical: No cervical adenopathy.     Upper Body:     Right upper body: No supraclavicular adenopathy.     Left upper body: No supraclavicular adenopathy.  Skin:    General: Skin is warm.     Findings: No erythema.  Neurological:     General: No focal deficit present.     Mental Status: He is alert and oriented to person, place, and time.     Cranial Nerves: No cranial nerve deficit.     Sensory: No sensory deficit.     Gait: Gait normal.     Deep Tendon Reflexes:     Reflex Scores:      Bicep reflexes are 2+ on the right side and 2+ on the left side.      Patellar reflexes are 2+ on the right side and 2+ on the left side. Psychiatric:        Mood and  Affect: Mood and affect normal.   ASSESSMENT AND PLAN:  Mr.Teo was seen today for annual exam.  Diagnoses and all orders for this visit:  Routine general medical examination at a health care facility Assessment & Plan: We discussed the importance of regular physical activity and healthy diet for prevention of chronic illness and/or complications. Preventive guidelines reviewed. Vaccination up to date. Next CPE in a year.   Screening for endocrine, metabolic and immunity disorder -     Basic metabolic panel; Future  Hyperlipidemia, unspecified hyperlipidemia type Assessment & Plan: Non pharmacologic treatment to continue for now. Further recommendations will be given according to 10 years CVD risk score and lipid panel numbers.  Orders: -     Lipid panel; Future  Benign prostatic hyperplasia with nocturia Assessment & Plan: Mild. Pharmacologic treatment is not indicated at this time. PSA has been stable, he would like to have this rechecked today.  Orders: -     PSA; Future  Circadian rhythm sleep disorder Assessment & Plan: He has not travelled much in the past year, so has not needed Ambien. He will let me know if he needs refills for Ambien 5 mg to continue at bedtime as needed when traveling overseas.   Thyroglossal duct cyst Assessment & Plan: No reported growth. We will continue monitoring TSH annually. Imaging can be considered if any change.  Orders: -     TSH; Future  Return in 1 year (on 02/19/2023) for CPE.  Talia Hoheisel G. [place], MD  [facility]. [place] office.

## 2022-02-18 ENCOUNTER — Encounter: Payer: Self-pay | Admitting: Family Medicine

## 2022-02-18 ENCOUNTER — Ambulatory Visit (INDEPENDENT_AMBULATORY_CARE_PROVIDER_SITE_OTHER): Payer: 59 | Admitting: Family Medicine

## 2022-02-18 VITALS — BP 120/78 | HR 70 | Temp 98.2°F | Resp 12 | Ht 68.0 in | Wt 176.1 lb

## 2022-02-18 DIAGNOSIS — Z1329 Encounter for screening for other suspected endocrine disorder: Secondary | ICD-10-CM

## 2022-02-18 DIAGNOSIS — G4720 Circadian rhythm sleep disorder, unspecified type: Secondary | ICD-10-CM

## 2022-02-18 DIAGNOSIS — Q892 Congenital malformations of other endocrine glands: Secondary | ICD-10-CM

## 2022-02-18 DIAGNOSIS — Z0000 Encounter for general adult medical examination without abnormal findings: Secondary | ICD-10-CM

## 2022-02-18 DIAGNOSIS — E785 Hyperlipidemia, unspecified: Secondary | ICD-10-CM | POA: Diagnosis not present

## 2022-02-18 DIAGNOSIS — R351 Nocturia: Secondary | ICD-10-CM | POA: Diagnosis not present

## 2022-02-18 DIAGNOSIS — Z130 Encounter for screening for diseases of the blood and blood-forming organs and certain disorders involving the immune mechanism: Secondary | ICD-10-CM

## 2022-02-18 DIAGNOSIS — Z13228 Encounter for screening for other metabolic disorders: Secondary | ICD-10-CM | POA: Diagnosis not present

## 2022-02-18 DIAGNOSIS — N401 Enlarged prostate with lower urinary tract symptoms: Secondary | ICD-10-CM | POA: Diagnosis not present

## 2022-02-18 LAB — BASIC METABOLIC PANEL
BUN: 14 mg/dL (ref 6–23)
CO2: 27 mEq/L (ref 19–32)
Calcium: 9.4 mg/dL (ref 8.4–10.5)
Chloride: 105 mEq/L (ref 96–112)
Creatinine, Ser: 0.98 mg/dL (ref 0.40–1.50)
GFR: 88.03 mL/min (ref >60.00)
Glucose, Bld: 90 mg/dL (ref 70–99)
Potassium: 5 mEq/L (ref 3.5–5.1)
Sodium: 139 mEq/L (ref 135–145)

## 2022-02-18 LAB — LIPID PANEL
Cholesterol: 188 mg/dL (ref 0–200)
HDL: 45.9 mg/dL (ref >39.00)
LDL Cholesterol: 117 mg/dL — ABNORMAL HIGH (ref 0–99)
NonHDL: 142.31
Total CHOL/HDL Ratio: 4
Triglycerides: 126 mg/dL (ref 0.0–149.0)
VLDL: 25.2 mg/dL (ref 0.0–40.0)

## 2022-02-18 LAB — PSA: PSA: 0.89 ng/mL (ref 0.10–4.00)

## 2022-02-18 LAB — TSH: TSH: 1.36 u[IU]/mL (ref 0.35–5.50)

## 2022-02-18 NOTE — Patient Instructions (Addendum)
A few things to remember from today's visit:  Routine general medical examination at a health care facility  Screening for endocrine, metabolic and immunity disorder - Plan: Basic metabolic panel  Hyperlipidemia, unspecified hyperlipidemia type - Plan: Lipid panel  Benign prostatic hyperplasia with nocturia - Plan: PSA  Circadian rhythm sleep disorder  Thyroglossal duct cyst - Plan: TSH  If you need refills for medications you take chronically, please call your pharmacy. Do not use My Chart to request refills or for acute issues that need immediate attention. If you send a my chart message, it may take a few days to be addressed, specially if I am not in the office.  Please be sure medication list is accurate. If a new problem present, please set up appointment sooner than planned today.  Health Maintenance, Male Adopting a healthy lifestyle and getting preventive care are important in promoting health and wellness. Ask your health care provider about: The right schedule for you to have regular tests and exams. Things you can do on your own to prevent diseases and keep yourself healthy. What should I know about diet, weight, and exercise? Eat a healthy diet  Eat a diet that includes plenty of vegetables, fruits, low-fat dairy products, and lean protein. Do not eat a lot of foods that are high in solid fats, added sugars, or sodium. Maintain a healthy weight Body mass index (BMI) is a measurement that can be used to identify possible weight problems. It estimates body fat based on height and weight. Your health care provider can help determine your BMI and help you achieve or maintain a healthy weight. Get regular exercise Get regular exercise. This is one of the most important things you can do for your health. Most adults should: Exercise for at least 150 minutes each week. The exercise should increase your heart rate and make you sweat (moderate-intensity exercise). Do  strengthening exercises at least twice a week. This is in addition to the moderate-intensity exercise. Spend less time sitting. Even light physical activity can be beneficial. Watch cholesterol and blood lipids Have your blood tested for lipids and cholesterol at 54 years of age, then have this test every 5 years. You may need to have your cholesterol levels checked more often if: Your lipid or cholesterol levels are high. You are older than 54 years of age. You are at high risk for heart disease. What should I know about cancer screening? Many types of cancers can be detected early and may often be prevented. Depending on your health history and family history, you may need to have cancer screening at various ages. This may include screening for: Colorectal cancer. Prostate cancer. Skin cancer. Lung cancer. What should I know about heart disease, diabetes, and high blood pressure? Blood pressure and heart disease High blood pressure causes heart disease and increases the risk of stroke. This is more likely to develop in people who have high blood pressure readings or are overweight. Talk with your health care provider about your target blood pressure readings. Have your blood pressure checked: Every 3-5 years if you are 28-72 years of age. Every year if you are 62 years old or older. If you are between the ages of 14 and 28 and are a current or former smoker, ask your health care provider if you should have a one-time screening for abdominal aortic aneurysm (AAA). Diabetes Have regular diabetes screenings. This checks your fasting blood sugar level. Have the screening done: Once every three years after  age 65 if you are at a normal weight and have a low risk for diabetes. More often and at a younger age if you are overweight or have a high risk for diabetes. What should I know about preventing infection? Hepatitis B If you have a higher risk for hepatitis B, you should be screened for  this virus. Talk with your health care provider to find out if you are at risk for hepatitis B infection. Hepatitis C Blood testing is recommended for: Everyone born from [birth year] through 1967. Anyone with known risk factors for hepatitis C. Sexually transmitted infections (STIs) You should be screened each year for STIs, including gonorrhea and chlamydia, if: You are sexually active and are younger than 54 years of age. You are older than 54 years of age and your health care provider tells you that you are at risk for this type of infection. Your sexual activity has changed since you were last screened, and you are at increased risk for chlamydia or gonorrhea. Ask your health care provider if you are at risk. Ask your health care provider about whether you are at high risk for HIV. Your health care provider may recommend a prescription medicine to help prevent HIV infection. If you choose to take medicine to prevent HIV, you should first get tested for HIV. You should then be tested every 3 months for as long as you are taking the medicine. Follow these instructions at home: Alcohol use Do not drink alcohol if your health care provider tells you not to drink. If you drink alcohol: Limit how much you have to 0-2 drinks a day. Know how much alcohol is in your drink. In the [place] one drink equals one 12 oz bottle of beer (355 mL), one 5 oz glass of wine (148 mL), or one 1 oz glass of hard liquor (44 mL). Lifestyle Do not use any products that contain nicotine or tobacco. These products include cigarettes, chewing tobacco, and vaping devices, such as e-cigarettes. If you need help quitting, ask your health care provider. Do not use street drugs. Do not share needles. Ask your health care provider for help if you need support or information about quitting drugs. General instructions Schedule regular health, dental, and eye exams. Stay current with your vaccines. Tell your health care provider  if: You often feel depressed. You have ever been abused or do not feel safe at home. Summary Adopting a healthy lifestyle and getting preventive care are important in promoting health and wellness. Follow your health care provider's instructions about healthy diet, exercising, and getting tested or screened for diseases. Follow your health care provider's instructions on monitoring your cholesterol and blood pressure. This information is not intended to replace advice given to you by your health care provider. Make sure you discuss any questions you have with your health care provider. Document Revised: 05/15/2020 Document Reviewed: 05/15/2020 Elsevier Patient Education  [place].

## 2022-02-18 NOTE — Assessment & Plan Note (Signed)
No reported growth. We will continue monitoring TSH annually. Imaging can be considered if any change.

## 2022-02-18 NOTE — Assessment & Plan Note (Signed)
We discussed the importance of regular physical activity and healthy diet for prevention of chronic illness and/or complications. Preventive guidelines reviewed. Vaccination up to date. Next CPE in a year.

## 2022-02-18 NOTE — Assessment & Plan Note (Signed)
Mild. Pharmacologic treatment is not indicated at this time. PSA has been stable, he would like to have this rechecked today.

## 2022-02-18 NOTE — Assessment & Plan Note (Signed)
He has not travelled much in the past year, so has not needed Ambien. He will let me know if he needs refills for Ambien 5 mg to continue at bedtime as needed when traveling [place].

## 2022-02-18 NOTE — Assessment & Plan Note (Signed)
Non pharmacologic treatment to continue for now. Further recommendations will be given according to 10 years CVD risk score and lipid panel numbers.  

## 2023-05-09 ENCOUNTER — Encounter: Payer: 59 | Admitting: Family Medicine

## 2023-05-15 NOTE — Progress Notes (Signed)
 HPI: [name] is a 53 y.o.male  with PMHx significant for HLD and BPH, here today for his routine physical examination.  Last CPE: 02/18/22  Exercise: Walking on average 1 hour a day. Avg 16 minute mile. Diet: Overall healthy, well-balanced diet.  Sleep: 8 hours a night.  Smoking: Never.  Alcohol consumption: None.  Dental: UTD with dental care.  Vision: UTD with routine vision exams.   Immunization History  Administered Date(s) Administered   Influenza,inj,Quad PF,6+ Mos 10/09/2013   Influenza-Unspecified 10/07/2011, 10/18/2015   MMR 05/16/2023   Tdap 03/13/2018    Health Maintenance  Topic Date Due   COVID-19 Vaccine (1 - 2024-25 season) 06/01/2023 (Originally 09/08/2022)   HIV Screening  05/16/2034 (Originally [birth year]1983)   INFLUENZA VACCINE  08/08/2023   Colonoscopy  06/18/2026   DTaP/Tdap/Td (2 - Td or Tdap) 03/12/2028   Hepatitis C Screening  Completed   HPV VACCINES  Aged Out   Meningococcal B Vaccine  Aged Out   Zoster Vaccines- Shingrix  Discontinued   - Last prostate ca screening: 02/18/22 BPH with nocturia: On average he gets up twice at night to use the bathroom.  Typically, he stops drinking fluids at 8 pm to avoid having to use the bathroom. Goes to bed at 10 pm.   Lab Results  Component Value Date   PSA 0.89 02/18/2022   PSA 0.89 05/01/2020   PSA 0.80 03/15/2019   Hyperlipidemia Pt is not currently prescribed any medications, but has been taking fish oil.  Lab Results  Component Value Date   CHOL 188 02/18/2022   HDL 45.90 02/18/2022   LDLCALC 117 (H) 02/18/2022   TRIG 126.0 02/18/2022   CHOLHDL 4 02/18/2022   Soft tissue/neck US  done in 03/2015: The thyroglossal duct cyst just above the is but is a slightly smaller, measuring up to 4.8 cm and previously measuring up to 5.9 cm. No growth. Lab Results  Component Value Date   TSH 1.36 02/18/2022   Acute concerns today:  Lightheadedness / Vertigo Pt reports occasional episodes of  vertigo and lightheadedness, ongoing for the last 6-9 months.  About 2 times a year he experiences a spinning sensation, this has been going on for years.  He reports his father and sister also experience similar symptoms.  Denies any associated chest pain, dyspnea, diaphoresis, or pre-syncopal feeling  Mentions right ear hearing loss. He reports while his wife gets ready, he tends to hear more from his left ear when lying on his right side.  When lying on his left side, he does not hear much from his right ear.  Problem has been gradual. Overall, he does not notice a difference in hearing throughout the day.  S/P myringoplasty during childhood.  He reports plans to travel to [place] later this year and is interested in additional travel immunizations (measles vaccine).   Review of Systems  Constitutional:  Negative for activity change, appetite change and fever.  HENT:  Positive for hearing loss. Negative for nosebleeds, sore throat and trouble swallowing.   Eyes:  Negative for redness and visual disturbance.  Respiratory:  Negative for cough, shortness of breath and wheezing.   Cardiovascular:  Negative for chest pain, palpitations and leg swelling.  Gastrointestinal:  Negative for abdominal pain, blood in stool, nausea and vomiting.  Endocrine: Negative for cold intolerance, heat intolerance, polydipsia, polyphagia and polyuria.  Genitourinary:  Negative for decreased urine volume, dysuria, genital sores, hematuria and testicular pain.  Musculoskeletal:  Negative for gait  problem and myalgias.  Skin:  Negative for color change and rash.  Allergic/Immunologic: Positive for environmental allergies.  Neurological:  Positive for dizziness. Negative for syncope, weakness and headaches.  Hematological:  Negative for adenopathy. Does not bruise/bleed easily.  Psychiatric/Behavioral:  Negative for confusion and sleep disturbance.   All other systems reviewed and are negative.  Current  Outpatient Medications on File Prior to Visit  Medication Sig Dispense Refill   Ascorbic Acid (VITAMIN C) 1000 MG tablet Take 1,000 mg by mouth daily.     cetirizine (ZYRTEC) 10 MG tablet Take 10 mg by mouth daily.     Omega-3 Fatty Acids (FISH OIL) 1000 MG CAPS Take by mouth daily.     Vitamin D, Cholecalciferol, 50 MCG (2000 UT) CAPS Take by mouth.     zinc gluconate 50 MG tablet Take 50 mg by mouth daily.     zolpidem  (AMBIEN ) 5 MG tablet Take 1 tablet (5 mg total) by mouth at bedtime as needed for sleep. 30 tablet 1   No current facility-administered medications on file prior to visit.   Past Medical History:  Diagnosis Date   Allergic rhinitis    Allergy    Chicken pox    Hypercholesteremia    Thyroglossal duct cyst    Followed with ENT last in 2015   Past Surgical History:  Procedure Laterality Date   EYE SURGERY     Corneal Lasik,bilateral;01/2001   INGUINAL HERNIA REPAIR Bilateral 1992   MYRINGOPLASTY     at age 53.   REFRACTIVE SURGERY  2005   WISDOM TOOTH EXTRACTION     No Known Allergies  Family History  Problem Relation Age of Onset   Heart disease Maternal Grandmother    Hypertension Maternal Grandmother    Stroke Maternal Grandfather    Cancer Paternal Grandmother        ? Brain   Heart disease Paternal Grandfather        MI   Colon cancer Neg Hx    Colon polyps Neg Hx    Esophageal cancer Neg Hx    Stomach cancer Neg Hx    Rectal cancer Neg Hx     Social History   Socioeconomic History   Marital status: Married    Spouse name: Not on file   Number of children: Not on file   Years of education: Not on file   Highest education level: Not on file  Occupational History   Not on file  Tobacco Use   Smoking status: Never   Smokeless tobacco: Never  Substance and Sexual Activity   Alcohol use: No   Drug use: No   Sexual activity: Yes  Other Topics Concern   Not on file  Social History Narrative   The patient is married with 2 children, ages 70  and 30. He is a lifetime nonsmoker. He drinks alcohol only on rare occasions. He works as a Location manager at [employer].   Social Drivers of Corporate investment banker Strain: Not on file  Food Insecurity: Not on file  Transportation Needs: Not on file  Physical Activity: Not on file  Stress: Not on file  Social Connections: Unknown (08/12/2022)   [facility]   Social Network    Social Network: Not on file   Vitals:   05/16/23 0949 05/16/23 1020  BP: (!) 116/90 110/85  Pulse: 70   Resp: 12   Temp: (!) 97.5 F (36.4 C)   SpO2: 96%  Body mass index is 27.46 kg/m.  Wt Readings from Last 3 Encounters:  05/16/23 180 lb 9.6 oz (81.9 kg)  02/18/22 176 lb 2 oz (79.9 kg)  05/01/20 168 lb 6.4 oz (76.4 kg)   Physical Exam Vitals and nursing note reviewed.  Constitutional:      General: He is not in acute distress.    Appearance: He is well-developed.  HENT:     Head: Normocephalic and atraumatic.     Right Ear: Ear canal and external ear normal. Tympanic membrane is scarred (minimal). Tympanic membrane is not erythematous or bulging.     Left Ear: Tympanic membrane, ear canal and external ear normal.  Eyes:     Extraocular Movements: Extraocular movements intact.     Conjunctiva/sclera: Conjunctivae normal.     Pupils: Pupils are equal, round, and reactive to light.  Neck:     Thyroid : No thyroid  mass or thyromegaly.  Cardiovascular:     Rate and Rhythm: Normal rate and regular rhythm.     Pulses:          Dorsalis pedis pulses are 2+ on the right side and 2+ on the left side.     Heart sounds: No murmur heard. Pulmonary:     Effort: Pulmonary effort is normal. No respiratory distress.     Breath sounds: Normal breath sounds.  Abdominal:     Palpations: Abdomen is soft. There is no hepatomegaly or mass.     Tenderness: There is no abdominal tenderness.  Genitourinary:    Comments: No concerns. Musculoskeletal:        General: No tenderness.     Cervical  back: Normal range of motion.     Comments: No major deformities appreciated and no signs of synovitis.  Lymphadenopathy:     Cervical: No cervical adenopathy.     Upper Body:     Right upper body: No supraclavicular adenopathy.     Left upper body: No supraclavicular adenopathy.  Skin:    General: Skin is warm.     Findings: No erythema.  Neurological:     General: No focal deficit present.     Mental Status: He is alert and oriented to person, place, and time.     Cranial Nerves: No cranial nerve deficit.     Sensory: No sensory deficit.     Gait: Gait normal.     Deep Tendon Reflexes:     Reflex Scores:      Bicep reflexes are 2+ on the right side and 2+ on the left side.      Patellar reflexes are 2+ on the right side and 2+ on the left side. Psychiatric:        Mood and Affect: Mood and affect normal.   ASSESSMENT AND PLAN: Cristian Heath was seen today for a comprehensive physical exam.    Orders Placed This Encounter  Procedures   MMR vaccine subcutaneous   CBC   TSH   Comprehensive metabolic panel with GFR   Lipid panel   PSA   Lab Results  Component Value Date   PSA 1.16 05/16/2023   PSA 0.89 02/18/2022   PSA 0.89 05/01/2020   Lab Results  Component Value Date   NA 137 05/16/2023   CL 100 05/16/2023   K 5.2 No hemolysis seen (H) 05/16/2023   CO2 29 05/16/2023   BUN 13 05/16/2023   CREATININE 1.07 05/16/2023   GFR 78.53 05/16/2023   CALCIUM 9.6 05/16/2023  ALBUMIN 4.7 05/16/2023   GLUCOSE 85 05/16/2023   Lab Results  Component Value Date   TSH 1.14 05/16/2023    Lab Results  Component Value Date   ALT 10 05/16/2023   AST 13 05/16/2023   ALKPHOS 63 05/16/2023   BILITOT 0.7 05/16/2023   Lab Results  Component Value Date   WBC 5.1 05/16/2023   HGB 15.1 05/16/2023   HCT 45.1 05/16/2023   MCV 95.9 05/16/2023   PLT 287.0 05/16/2023    Lab Results  Component Value Date   CHOL 227 (H) 05/16/2023   HDL 61.70 05/16/2023   LDLCALC  158 (H) 05/16/2023   TRIG 40.0 05/16/2023   CHOLHDL 4 05/16/2023   The 10-year ASCVD risk score (Arnett DK, et al., 2019) is: 3.8%   Values used to calculate the score:     Age: 67 years     Sex: Male     Is Non-Hispanic African American: No     Diabetic: No     Tobacco smoker: No     Systolic Blood Pressure: 110 mmHg     Is BP treated: No     HDL Cholesterol: 61.7 mg/dL     Total Cholesterol: 227 mg/dL  Routine general medical examination at a health care facility Assessment & Plan: We discussed the importance of regular physical activity and healthy diet for prevention of chronic illness and/or complications. Preventive guidelines reviewed. Vaccination: Prefers to hold on Prevnar 20. MMR given today because planning on going to [place] latter this year. Next CPE in a year.  Benign prostatic hyperplasia with nocturia Assessment & Plan: Symptoms otherwise stable. We discussed pharmacological options, for now he is not interested in doing so he will let me know if he does. Monitor for new symptoms. Try to avoid fluid intake 3 to 4 hours before bedtime. PSA ordered today.  Orders: -     PSA; Future  Hyperlipidemia, unspecified hyperlipidemia type Assessment & Plan: Mild. Currently on nonpharmacologic treatment. Further recommendations will be given according to 10 years CVD risk score and lipid panel numbers.  Orders: -     Comprehensive metabolic panel with GFR; Future -     Lipid panel; Future  Screening for endocrine, metabolic and immunity disorder -     Comprehensive metabolic panel with GFR; Future  Counseling for travel -     MMR vaccine subcutaneous  Lightheadedness We discussed possible etiologies. History and examination today do not suggest a serious process. Monitor for new symptoms.  Reporting mild right-sided hearing loss, he is not interested in hearing screening today.  -     CBC; Future  Thyroglossal duct cyst Assessment & Plan: No growth  reported. Will continua annual TSH.  Orders: -     TSH; Future  Return in 1 year (on 05/15/2024) for CPE.  I, Bernita Bristle, acting as a scribe for Terika Pillard [place], MD., have documented all relevant documentation on the behalf of Yitta Gongaware [place], MD, as directed by   while in the presence of Irish Piech [place], MD.  I, Melchor Kirchgessner [place], MD, have reviewed all documentation for this visit. The documentation on 05/16/23 for the exam, diagnosis, procedures, and orders are all accurate and complete.  Morrissa Shein G. [place], MD  [facility]. [place] office.

## 2023-05-16 ENCOUNTER — Ambulatory Visit (INDEPENDENT_AMBULATORY_CARE_PROVIDER_SITE_OTHER): Admitting: Family Medicine

## 2023-05-16 ENCOUNTER — Encounter: Payer: Self-pay | Admitting: Family Medicine

## 2023-05-16 VITALS — BP 110/85 | HR 70 | Temp 97.5°F | Resp 12 | Ht 68.0 in | Wt 180.6 lb

## 2023-05-16 DIAGNOSIS — Z0001 Encounter for general adult medical examination with abnormal findings: Secondary | ICD-10-CM | POA: Diagnosis not present

## 2023-05-16 DIAGNOSIS — Z13228 Encounter for screening for other metabolic disorders: Secondary | ICD-10-CM | POA: Diagnosis not present

## 2023-05-16 DIAGNOSIS — E785 Hyperlipidemia, unspecified: Secondary | ICD-10-CM | POA: Diagnosis not present

## 2023-05-16 DIAGNOSIS — R42 Dizziness and giddiness: Secondary | ICD-10-CM | POA: Diagnosis not present

## 2023-05-16 DIAGNOSIS — Z130 Encounter for screening for diseases of the blood and blood-forming organs and certain disorders involving the immune mechanism: Secondary | ICD-10-CM | POA: Diagnosis not present

## 2023-05-16 DIAGNOSIS — Q892 Congenital malformations of other endocrine glands: Secondary | ICD-10-CM | POA: Diagnosis not present

## 2023-05-16 DIAGNOSIS — Z7184 Encounter for health counseling related to travel: Secondary | ICD-10-CM

## 2023-05-16 DIAGNOSIS — Z1329 Encounter for screening for other suspected endocrine disorder: Secondary | ICD-10-CM | POA: Diagnosis not present

## 2023-05-16 DIAGNOSIS — Z23 Encounter for immunization: Secondary | ICD-10-CM | POA: Diagnosis not present

## 2023-05-16 DIAGNOSIS — R351 Nocturia: Secondary | ICD-10-CM | POA: Diagnosis not present

## 2023-05-16 DIAGNOSIS — Z0000 Encounter for general adult medical examination without abnormal findings: Secondary | ICD-10-CM

## 2023-05-16 DIAGNOSIS — E875 Hyperkalemia: Secondary | ICD-10-CM

## 2023-05-16 DIAGNOSIS — N401 Enlarged prostate with lower urinary tract symptoms: Secondary | ICD-10-CM | POA: Diagnosis not present

## 2023-05-16 LAB — LIPID PANEL
Cholesterol: 227 mg/dL — ABNORMAL HIGH (ref 0–200)
HDL: 61.7 mg/dL (ref >39.00)
LDL Cholesterol: 158 mg/dL — ABNORMAL HIGH (ref 0–99)
NonHDL: 165.67
Total CHOL/HDL Ratio: 4
Triglycerides: 40 mg/dL (ref 0.0–149.0)
VLDL: 8 mg/dL (ref 0.0–40.0)

## 2023-05-16 LAB — COMPREHENSIVE METABOLIC PANEL WITH GFR
ALT: 10 U/L (ref 0–53)
AST: 13 U/L (ref 0–37)
Albumin: 4.7 g/dL (ref 3.5–5.2)
Alkaline Phosphatase: 63 U/L (ref 39–117)
BUN: 13 mg/dL (ref 6–23)
CO2: 29 meq/L (ref 19–32)
Calcium: 9.6 mg/dL (ref 8.4–10.5)
Chloride: 100 meq/L (ref 96–112)
Creatinine, Ser: 1.07 mg/dL (ref 0.40–1.50)
GFR: 78.53 mL/min (ref >60.00)
Glucose, Bld: 85 mg/dL (ref 70–99)
Potassium: 5.2 meq/L — ABNORMAL HIGH (ref 3.5–5.1)
Sodium: 137 meq/L (ref 135–145)
Total Bilirubin: 0.7 mg/dL (ref 0.2–1.2)
Total Protein: 7 g/dL (ref 6.0–8.3)

## 2023-05-16 LAB — CBC
HCT: 45.1 % (ref 39.0–52.0)
Hemoglobin: 15.1 g/dL (ref 13.0–17.0)
MCHC: 33.5 g/dL (ref 30.0–36.0)
MCV: 95.9 fl (ref 78.0–100.0)
Platelets: 287 10*3/uL (ref 150.0–400.0)
RBC: 4.7 Mil/uL (ref 4.22–5.81)
RDW: 12.9 % (ref 11.5–15.5)
WBC: 5.1 10*3/uL (ref 4.0–10.5)

## 2023-05-16 LAB — PSA: PSA: 1.16 ng/mL (ref 0.10–4.00)

## 2023-05-16 LAB — TSH: TSH: 1.14 u[IU]/mL (ref 0.35–5.50)

## 2023-05-16 NOTE — Assessment & Plan Note (Signed)
 We discussed the importance of regular physical activity and healthy diet for prevention of chronic illness and/or complications. Preventive guidelines reviewed. Vaccination: Prefers to hold on Prevnar 20. MMR given today because planning on going to [place] latter this year. Next CPE in a year.

## 2023-05-16 NOTE — Assessment & Plan Note (Addendum)
 No growth reported. Will continua annual TSH.

## 2023-05-16 NOTE — Assessment & Plan Note (Signed)
 Mild. Currently on nonpharmacologic treatment. Further recommendations will be given according to 10 years CVD risk score and lipid panel numbers.

## 2023-05-16 NOTE — Assessment & Plan Note (Signed)
 Symptoms otherwise stable. We discussed pharmacological options, for now he is not interested in doing so he will let me know if he does. Monitor for new symptoms. Try to avoid fluid intake 3 to 4 hours before bedtime. PSA ordered today.

## 2023-05-16 NOTE — Patient Instructions (Addendum)
 A few things to remember from today's visit:  Routine general medical examination at a health care facility  Benign prostatic hyperplasia with nocturia - Plan: PSA  Hyperlipidemia, unspecified hyperlipidemia type - Plan: Comprehensive metabolic panel with GFR, Lipid panel  Screening for endocrine, metabolic and immunity disorder - Plan: Comprehensive metabolic panel with GFR  Counseling for travel  Lightheadedness - Plan: CBC  Thyroglossal duct cyst - Plan: TSH  Do not use My Chart to request refills or for acute issues that need immediate attention. If you send a my chart message, it may take a few days to be addressed, specially if I am not in the office.  Please be sure medication list is accurate. If a new problem present, please set up appointment sooner than planned today.  Health Maintenance, Male Adopting a healthy lifestyle and getting preventive care are important in promoting health and wellness. Ask your health care provider about: The right schedule for you to have regular tests and exams. Things you can do on your own to prevent diseases and keep yourself healthy. What should I know about diet, weight, and exercise? Eat a healthy diet  Eat a diet that includes plenty of vegetables, fruits, low-fat dairy products, and lean protein. Do not eat a lot of foods that are high in solid fats, added sugars, or sodium. Maintain a healthy weight Body mass index (BMI) is a measurement that can be used to identify possible weight problems. It estimates body fat based on height and weight. Your health care provider can help determine your BMI and help you achieve or maintain a healthy weight. Get regular exercise Get regular exercise. This is one of the most important things you can do for your health. Most adults should: Exercise for at least 150 minutes each week. The exercise should increase your heart rate and make you sweat (moderate-intensity exercise). Do strengthening  exercises at least twice a week. This is in addition to the moderate-intensity exercise. Spend less time sitting. Even light physical activity can be beneficial. Watch cholesterol and blood lipids Have your blood tested for lipids and cholesterol at 55 years of age, then have this test every 5 years. You may need to have your cholesterol levels checked more often if: Your lipid or cholesterol levels are high. You are older than 55 years of age. You are at high risk for heart disease. What should I know about cancer screening? Many types of cancers can be detected early and may often be prevented. Depending on your health history and family history, you may need to have cancer screening at various ages. This may include screening for: Colorectal cancer. Prostate cancer. Skin cancer. Lung cancer. What should I know about heart disease, diabetes, and high blood pressure? Blood pressure and heart disease High blood pressure causes heart disease and increases the risk of stroke. This is more likely to develop in people who have high blood pressure readings or are overweight. Talk with your health care provider about your target blood pressure readings. Have your blood pressure checked: Every 3-5 years if you are 45-2 years of age. Every year if you are 82 years old or older. If you are between the ages of 55 and 31 and are a current or former smoker, ask your health care provider if you should have a one-time screening for abdominal aortic aneurysm (AAA). Diabetes Have regular diabetes screenings. This checks your fasting blood sugar level. Have the screening done: Once every three years after age 3  if you are at a normal weight and have a low risk for diabetes. More often and at a younger age if you are overweight or have a high risk for diabetes. What should I know about preventing infection? Hepatitis B If you have a higher risk for hepatitis B, you should be screened for this virus. Talk  with your health care provider to find out if you are at risk for hepatitis B infection. Hepatitis C Blood testing is recommended for: Everyone born from [birth year] through 1967. Anyone with known risk factors for hepatitis C. Sexually transmitted infections (STIs) You should be screened each year for STIs, including gonorrhea and chlamydia, if: You are sexually active and are younger than 55 years of age. You are older than 55 years of age and your health care provider tells you that you are at risk for this type of infection. Your sexual activity has changed since you were last screened, and you are at increased risk for chlamydia or gonorrhea. Ask your health care provider if you are at risk. Ask your health care provider about whether you are at high risk for HIV. Your health care provider may recommend a prescription medicine to help prevent HIV infection. If you choose to take medicine to prevent HIV, you should first get tested for HIV. You should then be tested every 3 months for as long as you are taking the medicine. Follow these instructions at home: Alcohol use Do not drink alcohol if your health care provider tells you not to drink. If you drink alcohol: Limit how much you have to 0-2 drinks a day. Know how much alcohol is in your drink. In the [place] one drink equals one 12 oz bottle of beer (355 mL), one 5 oz glass of wine (148 mL), or one 1 oz glass of hard liquor (44 mL). Lifestyle Do not use any products that contain nicotine or tobacco. These products include cigarettes, chewing tobacco, and vaping devices, such as e-cigarettes. If you need help quitting, ask your health care provider. Do not use street drugs. Do not share needles. Ask your health care provider for help if you need support or information about quitting drugs. General instructions Schedule regular health, dental, and eye exams. Stay current with your vaccines. Tell your health care provider if: You often feel  depressed. You have ever been abused or do not feel safe at home. Summary Adopting a healthy lifestyle and getting preventive care are important in promoting health and wellness. Follow your health care provider's instructions about healthy diet, exercising, and getting tested or screened for diseases. Follow your health care provider's instructions on monitoring your cholesterol and blood pressure. This information is not intended to replace advice given to you by your health care provider. Make sure you discuss any questions you have with your health care provider. Document Revised: 05/15/2020 Document Reviewed: 05/15/2020 Elsevier Patient Education  2024 [employer].

## 2023-05-20 ENCOUNTER — Other Ambulatory Visit (INDEPENDENT_AMBULATORY_CARE_PROVIDER_SITE_OTHER)

## 2023-05-20 ENCOUNTER — Ambulatory Visit: Payer: Self-pay | Admitting: Family Medicine

## 2023-05-20 DIAGNOSIS — E875 Hyperkalemia: Secondary | ICD-10-CM | POA: Diagnosis not present

## 2023-05-20 LAB — POTASSIUM: Potassium: 4.4 meq/L (ref 3.5–5.1)
